# Patient Record
Sex: Male | Born: 1950 | Race: White | Hispanic: No | Marital: Married | Smoking: Never smoker
Health system: Southern US, Community
[De-identification: ages and names within clinical notes are randomized; demographics above are authoritative.]

## PROBLEM LIST (undated history)

## (undated) DIAGNOSIS — N400 Enlarged prostate without lower urinary tract symptoms: Secondary | ICD-10-CM

## (undated) DIAGNOSIS — R972 Elevated prostate specific antigen [PSA]: Secondary | ICD-10-CM

## (undated) HISTORY — DX: Elevated prostate specific antigen (PSA): R97.20

## (undated) HISTORY — DX: Benign prostatic hyperplasia without lower urinary tract symptoms: N40.0

---

## 2003-02-09 HISTORY — PX: CYST EXCISION: SHX5701

## 2016-02-09 HISTORY — PX: GREEN LIGHT LASER TURP (TRANSURETHRAL RESECTION OF PROSTATE: SHX6260

## 2019-10-14 ENCOUNTER — Other Ambulatory Visit: Payer: Self-pay | Admitting: General Surgery

## 2019-10-14 NOTE — Progress Notes (Signed)
CSN MRN  [id number]   Initial consult Open   10/11/2019 [facility] \ \  Umbilical hernia without obstruction and without gangrene +1 more   Dx  Umbilical Hernia; Referred by Self   Reason for Visit    Progress Notes Perry Bell, Geronimo Boot, MD (Physician) . Marland Kitchen General Surgery Unsigned Expand AllCollapse All   Subjective:     Patient ID: Perry Bell is a 69 y.o. male.  HPI  The following portions of the patient's history were reviewed and updated as appropriate.  This a new patient is here today for: office visit. Patient is here today for evaluation of a longstanding umbilical hernia and a fairly recent onset right inguinal hernia.. The patient reports the latter has been present since the beginning of the year.  Slow progression into the top of the scrotum.  Occasionally, with bowel movements he will notice increased discomfort in the right hernia area.  Reduced each morning on arising.  The patient reports when he moved to the [place] he and his wife lived on a house boat for about 3-4 years.  He was working part-time at [employer] and found that he was getting less relaxation and he had when he had a full-time job prior to retirement.  Bowel movements are daily without history of blood or mucus.  The patient reports he had marked relief of his prostatic symptoms with greenlight laser treatment.  No anesthesia complications during this procedure.  The patient recently moved to [place] full time in August from the [place] to be closer to his 5 grandchildren.       Chief Complaint  Patient presents with  . Umbilical Hernia     BP 329/51   Pulse 110   Temp 36.8 C (98.3 F)   Ht 177.8 cm (5\' 10" )   Wt 98.9 kg (218 lb)   SpO2 97%   BMI 31.28 kg/m   History reviewed. No pertinent past medical history.        Past Surgical History:  Procedure Laterality Date  . COLONOSCOPY    . cyst removed testicle  2011   . prostate surgery  2019   green laser       Social History          Socioeconomic History  . Marital status: Married    Spouse name: Not on file  . Number of children: Not on file  . Years of education: Not on file  . Highest education level: Not on file  Occupational History  . Not on file  Tobacco Use  . Smoking status: Never Smoker  . Smokeless tobacco: Never Used  Substance and Sexual Activity  . Alcohol use: Not Currently  . Drug use: Never  . Sexual activity: Not on file  Other Topics Concern  . Not on file  Social History Narrative  . Not on file   Social Determinants of Health      Financial Resource Strain:   . Difficulty of Paying Living Expenses:   Food Insecurity:   . Worried About Charity fundraiser in the Last Year:   . Arboriculturist in the Last Year:   Transportation Needs:   . Film/video editor (Medical):   Marland Kitchen Lack of Transportation (Non-Medical):            Allergies  Allergen Reactions  . Penicillins Rash    Current Medications  Current Outpatient Medications  Medication Sig Dispense Refill  . multivitamin capsule Take 1 capsule by mouth once daily    . tamsulosin (FLOMAX) 0.4 mg capsule Take 0.4 mg by mouth 2 (two) times daily Take 30 minutes after same meal each day.     No current facility-administered medications for this visit.           Family History  Problem Relation Age of Onset  . Colon cancer Maternal Uncle   . Colon cancer Maternal Grandfather   . Breast cancer Daughter      Labs and Radiology:          Review of Systems  Constitutional: Negative for chills and fever.  Respiratory: Negative for cough.        Objective:   Physical Exam Constitutional:      Appearance: Normal appearance.  HENT:     Head: Normocephalic and atraumatic.  Eyes:     General: No scleral icterus.    Conjunctiva/sclera: Conjunctivae normal.     Pupils: Pupils are equal,  round, and reactive to light.  Cardiovascular:     Rate and Rhythm: Normal rate and regular rhythm.     Pulses: Normal pulses.          Femoral pulses are 2+ on the right side and 2+ on the left side.    Heart sounds: Normal heart sounds.  Pulmonary:     Effort: Pulmonary effort is normal.     Breath sounds: Normal breath sounds.  Abdominal:     General: Abdomen is flat and scaphoid. Bowel sounds are normal.    Genitourinary:    Penis: Uncircumcised.      Testes: Normal.  Musculoskeletal:     Cervical back: Normal range of motion and neck supple.     Right lower leg: No edema.     Left lower leg: No edema.  Lymphadenopathy:     Lower Body: No right inguinal adenopathy. No left inguinal adenopathy.  Skin:    General: Skin is warm and dry.  Neurological:     Mental Status: He is alert and oriented to person, place, and time.  Psychiatric:        Mood and Affect: Mood normal.        Behavior: Behavior normal.        Assessment:     Right scrotal hernia, new onset since January 2021.  Longstanding umbilical defect, without significant interval change.  Overdue for follow-up colonoscopy.  (Screening).    Plan:     Indications for elective inguinal hernia repair reviewed.  Role of prosthetic mesh placement discussed.  Opportunity for referral to laparoscopic/robotic physician if desired.  (Declined).  We will defer colonoscopy until after his hernias are repaired.    The patient has been scheduled for right open inguinal hernia repair and umbilical hernia repair for 10-26-19 at [facility].   Entered by Ledell Noss, CMA, acting as a scribe for Dr. Hervey Ard, MD.   The documentation recorded by the scribe accurately reflects the service I personally performed and the decisions made by me.   Robert Bellow, MD FACS

## 2019-10-19 ENCOUNTER — Other Ambulatory Visit
Admission: RE | Admit: 2019-10-19 | Discharge: 2019-10-19 | Disposition: A | Discharge status: Home / Self Care | Payer: Medicare Other | Source: Ambulatory Visit | Attending: General Surgery | Admitting: General Surgery

## 2019-10-19 ENCOUNTER — Other Ambulatory Visit: Payer: Self-pay

## 2019-10-19 NOTE — Patient Instructions (Signed)
Your procedure is scheduled on: Friday October 26, 2019. Report to Day Surgery inside [facility] 2nd floor. To find out your arrival time please call [phone] between 1PM - 3PM on Thursday October 25, 2019.  Remember: Instructions that are not followed completely may result in serious medical risk,  up to and including death, or upon the discretion of your surgeon and anesthesiologist your  surgery may need to be rescheduled.     _X__ 1. Do not eat food after midnight the night before your procedure.                 No chewing gum or hard candies. You may drink clear liquids up to 2 hours                 before you are scheduled to arrive for your surgery- DO not drink clear                 liquids within 2 hours of the start of your surgery.                 Clear Liquids include:  water, apple juice without pulp, clear Gatorade, G2 or                  Gatorade Zero (avoid Red/Purple/Blue), Black Coffee or Tea (Do not add                 anything to coffee or tea).  __X__2.  On the morning of surgery brush your teeth with toothpaste and water, you                may rinse your mouth with mouthwash if you wish.  Do not swallow any toothpaste of mouthwash.     _X__ 3.  No Alcohol for 24 hours before or after surgery.   _X__ 4.  Do Not Smoke or use e-cigarettes For 24 Hours Prior to Your Surgery.                 Do not use any chewable tobacco products for at least 6 hours prior to                 Surgery.  _X__  5.  Do not use any recreational drugs (marijuana, cocaine, heroin, ecstasy, MDMA or other)                For at least one week prior to your surgery.  Combination of these drugs with anesthesia                May have life threatening results.  __x__ 6.  Notify your doctor if there is any change in your medical condition      (cold, fever, infections).     Do not wear jewelry, make-up, hairpins, clips or nail polish. Do not wear lotions,  powders, or perfumes. You may wear deodorant. Do not shave 48 hours prior to surgery. Men may shave face and neck. Do not bring valuables to the hospital.    [facility] is not responsible for any belongings or valuables.  Contacts, dentures or bridgework may not be worn into surgery. Leave your suitcase in the car. After surgery it may be brought to your room. For patients admitted to the hospital, discharge time is determined by your treatment team.   Patients discharged the day of surgery will not be allowed to drive home.   Make arrangements for someone to be  with you for the first 24 hours of your Same Day Discharge.   _x__ Take these medicines the morning of surgery with A SIP OF WATER:    1. tamsulosin (FLOMAX) 0.4 MG   __X__ Use CHG Soap (or wipes) as directed  __x__ Stop Anti-inflammatories such as Ibuprofen, Aleve, Advil, naproxen, aspirin and or BC powders.    __x__ Stop supplements until after surgery.    __x__ Do not start any herbal supplements before your surgery.    If you have any questions regarding your pre-procedure instructions,  Please call Pre-admit Testing at [phone].

## 2019-10-24 ENCOUNTER — Encounter
Admission: RE | Admit: 2019-10-24 | Discharge: 2019-10-24 | Disposition: A | Discharge status: Home / Self Care | Payer: Medicare Other | Source: Ambulatory Visit | Attending: General Surgery | Admitting: General Surgery

## 2019-10-24 ENCOUNTER — Other Ambulatory Visit: Payer: Self-pay

## 2019-10-24 DIAGNOSIS — R54 Age-related physical debility: Secondary | ICD-10-CM | POA: Diagnosis not present

## 2019-10-24 DIAGNOSIS — Z01818 Encounter for other preprocedural examination: Secondary | ICD-10-CM | POA: Diagnosis not present

## 2019-10-24 DIAGNOSIS — Z20822 Contact with and (suspected) exposure to covid-19: Secondary | ICD-10-CM | POA: Insufficient documentation

## 2019-10-24 LAB — BASIC METABOLIC PANEL
Anion gap: 11 (ref 5–15)
BUN: 15 mg/dL (ref 8–23)
CO2: 26 mmol/L (ref 22–32)
Calcium: 9.5 mg/dL (ref 8.9–10.3)
Chloride: 102 mmol/L (ref 98–111)
Creatinine, Ser: 1.2 mg/dL (ref 0.61–1.24)
GFR calc Af Amer: >60 mL/min (ref >60)
GFR calc non Af Amer: >60 mL/min (ref >60)
Glucose, Bld: 115 mg/dL — ABNORMAL HIGH (ref 70–99)
Potassium: 4 mmol/L (ref 3.5–5.1)
Sodium: 139 mmol/L (ref 135–145)

## 2019-10-24 LAB — CBC WITH DIFFERENTIAL/PLATELET
Abs Immature Granulocytes: 0.02 10*3/uL (ref 0.00–0.07)
Basophils Absolute: 0.1 10*3/uL (ref 0.0–0.1)
Basophils Relative: 1 %
Eosinophils Absolute: 0.2 10*3/uL (ref 0.0–0.5)
Eosinophils Relative: 3 %
HCT: 44 % (ref 39.0–52.0)
Hemoglobin: 15.1 g/dL (ref 13.0–17.0)
Immature Granulocytes: 0 %
Lymphocytes Relative: 21 %
Lymphs Abs: 1.8 10*3/uL (ref 0.7–4.0)
MCH: 28.5 pg (ref 26.0–34.0)
MCHC: 34.3 g/dL (ref 30.0–36.0)
MCV: 83.2 fL (ref 80.0–100.0)
Monocytes Absolute: 0.6 10*3/uL (ref 0.1–1.0)
Monocytes Relative: 7 %
Neutro Abs: 5.6 10*3/uL (ref 1.7–7.7)
Neutrophils Relative %: 68 %
Platelets: 250 10*3/uL (ref 150–400)
RBC: 5.29 MIL/uL (ref 4.22–5.81)
RDW: 13 % (ref 11.5–15.5)
WBC: 8.3 10*3/uL (ref 4.0–10.5)
nRBC: 0 % (ref 0.0–0.2)

## 2019-10-25 LAB — SARS CORONAVIRUS 2 (TAT 6-24 HRS): SARS Coronavirus 2: NEGATIVE

## 2019-10-26 ENCOUNTER — Encounter: Payer: Self-pay | Admitting: General Surgery

## 2019-10-26 ENCOUNTER — Encounter
Admission: RE | Disposition: A | Discharge status: Home / Self Care | Payer: Self-pay | Source: Ambulatory Visit | Attending: General Surgery

## 2019-10-26 ENCOUNTER — Ambulatory Visit: Payer: Medicare Other | Admitting: Urgent Care

## 2019-10-26 ENCOUNTER — Ambulatory Visit
Admission: RE | Admit: 2019-10-26 | Discharge: 2019-10-26 | Disposition: A | Discharge status: Home / Self Care | Payer: Medicare Other | Source: Ambulatory Visit | Attending: General Surgery | Admitting: General Surgery

## 2019-10-26 ENCOUNTER — Other Ambulatory Visit: Payer: Self-pay

## 2019-10-26 DIAGNOSIS — Z800 Family history of malignant neoplasm of digestive organs: Secondary | ICD-10-CM | POA: Diagnosis not present

## 2019-10-26 DIAGNOSIS — K4090 Unilateral inguinal hernia, without obstruction or gangrene, not specified as recurrent: Secondary | ICD-10-CM | POA: Diagnosis present

## 2019-10-26 DIAGNOSIS — Z79899 Other long term (current) drug therapy: Secondary | ICD-10-CM | POA: Insufficient documentation

## 2019-10-26 DIAGNOSIS — Z880 Allergy status to penicillin: Secondary | ICD-10-CM | POA: Insufficient documentation

## 2019-10-26 DIAGNOSIS — Z803 Family history of malignant neoplasm of breast: Secondary | ICD-10-CM | POA: Diagnosis not present

## 2019-10-26 DIAGNOSIS — K429 Umbilical hernia without obstruction or gangrene: Secondary | ICD-10-CM | POA: Diagnosis not present

## 2019-10-26 DIAGNOSIS — K4030 Unilateral inguinal hernia, with obstruction, without gangrene, not specified as recurrent: Secondary | ICD-10-CM | POA: Insufficient documentation

## 2019-10-26 DIAGNOSIS — K660 Peritoneal adhesions (postprocedural) (postinfection): Secondary | ICD-10-CM | POA: Insufficient documentation

## 2019-10-26 HISTORY — PX: UMBILICAL HERNIA REPAIR: SHX196

## 2019-10-26 HISTORY — PX: INGUINAL HERNIA REPAIR: SHX194

## 2019-10-26 SURGERY — REPAIR, HERNIA, UMBILICAL, ADULT
Anesthesia: General | Site: Groin | Laterality: Right

## 2019-10-26 MED ORDER — FAMOTIDINE 20 MG PO TABS: 20.0000 mg | ORAL_TABLET | Freq: Once | ORAL | Status: AC

## 2019-10-26 MED ORDER — LIDOCAINE HCL (CARDIAC) PF 100 MG/5ML IV SOSY
PREFILLED_SYRINGE | INTRAVENOUS | Status: DC | PRN
  Administered 2019-10-26: 100 mg via INTRAVENOUS

## 2019-10-26 MED ORDER — 0.9 % SODIUM CHLORIDE (POUR BTL) OPTIME
TOPICAL | Status: DC | PRN
  Administered 2019-10-26: 500 mL

## 2019-10-26 MED ORDER — LIDOCAINE-EPINEPHRINE 1 %-1:100000 IJ SOLN
INTRAMUSCULAR | Status: DC | PRN
  Administered 2019-10-26: 30 mL

## 2019-10-26 MED ORDER — OXYCODONE HCL 5 MG PO TABS: 5.0000 mg | ORAL_TABLET | Freq: Once | ORAL | Status: DC | PRN

## 2019-10-26 MED ORDER — ONDANSETRON HCL 4 MG/2ML IJ SOLN: 4.0000 mg | Freq: Once | INTRAMUSCULAR | Status: AC | PRN

## 2019-10-26 MED ORDER — ACETAMINOPHEN 10 MG/ML IV SOLN
INTRAVENOUS | Status: DC | PRN
  Administered 2019-10-26: 1000 mg via INTRAVENOUS

## 2019-10-26 MED ORDER — EPHEDRINE SULFATE 50 MG/ML IJ SOLN
INTRAMUSCULAR | Status: DC | PRN
  Administered 2019-10-26: 5 mg via INTRAVENOUS
  Administered 2019-10-26: 10 mg via INTRAVENOUS
  Administered 2019-10-26: 5 mg via INTRAVENOUS

## 2019-10-26 MED ORDER — ACETAMINOPHEN 10 MG/ML IV SOLN
INTRAVENOUS | Status: AC
  Filled 2019-10-26: qty 100

## 2019-10-26 MED ORDER — CHLORHEXIDINE GLUCONATE 0.12 % MT SOLN: 15.0000 mL | Freq: Once | OROMUCOSAL | Status: AC

## 2019-10-26 MED ORDER — FENTANYL CITRATE (PF) 100 MCG/2ML IJ SOLN
INTRAMUSCULAR | Status: AC
  Filled 2019-10-26: qty 2

## 2019-10-26 MED ORDER — FAMOTIDINE 20 MG PO TABS
ORAL_TABLET | ORAL | Status: AC
  Administered 2019-10-26: 20 mg via ORAL
  Filled 2019-10-26: qty 1

## 2019-10-26 MED ORDER — KETOROLAC TROMETHAMINE 30 MG/ML IJ SOLN
INTRAMUSCULAR | Status: DC | PRN
  Administered 2019-10-26: 30 mg via INTRAVENOUS

## 2019-10-26 MED ORDER — PROPOFOL 10 MG/ML IV BOLUS
INTRAVENOUS | Status: DC | PRN
  Administered 2019-10-26: 50 mg via INTRAVENOUS
  Administered 2019-10-26: 150 mg via INTRAVENOUS

## 2019-10-26 MED ORDER — FENTANYL CITRATE (PF) 100 MCG/2ML IJ SOLN
INTRAMUSCULAR | Status: DC | PRN
Start: 2019-10-26 — End: 2019-10-26
  Administered 2019-10-26 (×5): 25 ug via INTRAVENOUS

## 2019-10-26 MED ORDER — LACTATED RINGERS IV SOLN: INTRAVENOUS | Status: DC

## 2019-10-26 MED ORDER — CHLORHEXIDINE GLUCONATE 0.12 % MT SOLN
OROMUCOSAL | Status: AC
  Administered 2019-10-26: 15 mL via OROMUCOSAL
  Filled 2019-10-26: qty 15

## 2019-10-26 MED ORDER — FENTANYL CITRATE (PF) 100 MCG/2ML IJ SOLN: 25.0000 ug | INTRAMUSCULAR | Status: DC | PRN

## 2019-10-26 MED ORDER — CEFAZOLIN SODIUM-DEXTROSE 2-4 GM/100ML-% IV SOLN
2.0000 g | INTRAVENOUS | Status: AC
  Administered 2019-10-26: 2 g via INTRAVENOUS

## 2019-10-26 MED ORDER — ONDANSETRON HCL 4 MG/2ML IJ SOLN
INTRAMUSCULAR | Status: DC | PRN
  Administered 2019-10-26: 4 mg via INTRAVENOUS

## 2019-10-26 MED ORDER — EPINEPHRINE PF 1 MG/ML IJ SOLN
INTRAMUSCULAR | Status: AC
  Filled 2019-10-26: qty 1

## 2019-10-26 MED ORDER — LIDOCAINE-EPINEPHRINE 1 %-1:100000 IJ SOLN
INTRAMUSCULAR | Status: AC
  Filled 2019-10-26: qty 2

## 2019-10-26 MED ORDER — DEXMEDETOMIDINE (PRECEDEX) IN NS 20 MCG/5ML (4 MCG/ML) IV SYRINGE
PREFILLED_SYRINGE | INTRAVENOUS | Status: AC
  Filled 2019-10-26: qty 5

## 2019-10-26 MED ORDER — BUPIVACAINE-EPINEPHRINE (PF) 0.5% -1:200000 IJ SOLN
INTRAMUSCULAR | Status: DC | PRN
  Administered 2019-10-26: 30 mL

## 2019-10-26 MED ORDER — LIDOCAINE HCL (PF) 2 % IJ SOLN
INTRAMUSCULAR | Status: AC
  Filled 2019-10-26: qty 5

## 2019-10-26 MED ORDER — DEXMEDETOMIDINE (PRECEDEX) IN NS 20 MCG/5ML (4 MCG/ML) IV SYRINGE
PREFILLED_SYRINGE | INTRAVENOUS | Status: DC | PRN
  Administered 2019-10-26 (×2): 4 ug via INTRAVENOUS

## 2019-10-26 MED ORDER — BUPIVACAINE HCL (PF) 0.5 % IJ SOLN
INTRAMUSCULAR | Status: AC
  Filled 2019-10-26: qty 30

## 2019-10-26 MED ORDER — OXYCODONE HCL 5 MG/5ML PO SOLN: 5.0000 mg | Freq: Once | ORAL | Status: DC | PRN

## 2019-10-26 MED ORDER — PROPOFOL 10 MG/ML IV BOLUS
INTRAVENOUS | Status: AC
  Filled 2019-10-26: qty 20

## 2019-10-26 MED ORDER — ONDANSETRON HCL 4 MG/2ML IJ SOLN
INTRAMUSCULAR | Status: AC
  Administered 2019-10-26: 4 mg via INTRAVENOUS
  Filled 2019-10-26: qty 2

## 2019-10-26 MED ORDER — CEFAZOLIN SODIUM-DEXTROSE 2-4 GM/100ML-% IV SOLN
INTRAVENOUS | Status: AC
  Filled 2019-10-26: qty 100

## 2019-10-26 MED ORDER — ORAL CARE MOUTH RINSE: 15.0000 mL | Freq: Once | OROMUCOSAL | Status: AC

## 2019-10-26 SURGICAL SUPPLY — 39 items
APL PRP STRL LF DISP 70% ISPRP (MISCELLANEOUS) ×2
BLADE SURG 15 STRL SS SAFETY (BLADE) ×6 IMPLANT
CANISTER SUCT 1200ML W/VALVE (MISCELLANEOUS) ×3 IMPLANT
CHLORAPREP W/TINT 26 (MISCELLANEOUS) ×3 IMPLANT
COVER WAND RF STERILE (DRAPES) ×3 IMPLANT
DECANTER SPIKE VIAL GLASS SM (MISCELLANEOUS) ×3 IMPLANT
DRAIN PENROSE 1/4X12 LTX STRL (WOUND CARE) ×3 IMPLANT
DRAPE LAPAROTOMY 100X77 ABD (DRAPES) ×3 IMPLANT
DRSG TEGADERM 4X4.75 (GAUZE/BANDAGES/DRESSINGS) ×4 IMPLANT
DRSG TELFA 4X3 1S NADH ST (GAUZE/BANDAGES/DRESSINGS) ×4 IMPLANT
ELECT REM PT RETURN 9FT ADLT (ELECTROSURGICAL) ×3
ELECTRODE REM PT RTRN 9FT ADLT (ELECTROSURGICAL) ×2 IMPLANT
GLOVE BIO SURGEON STRL SZ7.5 (GLOVE) ×3 IMPLANT
GLOVE INDICATOR 8.0 STRL GRN (GLOVE) ×3 IMPLANT
GOWN STRL REUS W/ TWL LRG LVL3 (GOWN DISPOSABLE) ×4 IMPLANT
GOWN STRL REUS W/TWL LRG LVL3 (GOWN DISPOSABLE) ×6
KIT TURNOVER KIT A (KITS) ×3 IMPLANT
LABEL OR SOLS (LABEL) ×3 IMPLANT
MESH MARLEX PLUG MEDIUM (Mesh General) ×3 IMPLANT
NDL HYPO 25X1 1.5 SAFETY (NEEDLE) ×2 IMPLANT
NEEDLE HYPO 22GX1.5 SAFETY (NEEDLE) ×6 IMPLANT
NEEDLE HYPO 25X1 1.5 SAFETY (NEEDLE) ×3 IMPLANT
NS IRRIG 500ML POUR BTL (IV SOLUTION) ×3 IMPLANT
PACK BASIN MINOR (MISCELLANEOUS) ×3 IMPLANT
STRIP CLOSURE SKIN 1/2X4 (GAUZE/BANDAGES/DRESSINGS) ×3 IMPLANT
SUT PDS AB 0 CT1 27 (SUTURE) ×3 IMPLANT
SUT PROLENE 0 CT 1 30 (SUTURE) ×3 IMPLANT
SUT SURGILON 0 BLK (SUTURE) ×6 IMPLANT
SUT VIC AB 2-0 SH 27 (SUTURE) ×6
SUT VIC AB 2-0 SH 27XBRD (SUTURE) ×2 IMPLANT
SUT VIC AB 3-0 54X BRD REEL (SUTURE) ×4 IMPLANT
SUT VIC AB 3-0 BRD 54 (SUTURE) ×6
SUT VIC AB 3-0 SH 27 (SUTURE) ×6
SUT VIC AB 3-0 SH 27X BRD (SUTURE) ×2 IMPLANT
SUT VIC AB 4-0 FS2 27 (SUTURE) ×4 IMPLANT
SWABSTK COMLB BENZOIN TINCTURE (MISCELLANEOUS) ×3 IMPLANT
SYR 10ML LL (SYRINGE) ×3 IMPLANT
SYR 3ML LL SCALE MARK (SYRINGE) ×3 IMPLANT
TAPE TRANSPORE STRL 2 31045 (GAUZE/BANDAGES/DRESSINGS) ×1 IMPLANT

## 2019-10-26 NOTE — Anesthesia Postprocedure Evaluation (Signed)
Anesthesia Post Note  Patient: Adonai Selsor  Procedure(s) Performed: HERNIA REPAIR UMBILICAL ADULT (N/A Abdomen) RIGHT INGUINAL HERNIA REPAIR WITH MESH (Right Groin)  Patient location during evaluation: PACU Anesthesia Type: General Level of consciousness: awake and alert Pain management: pain level controlled Vital Signs Assessment: post-procedure vital signs reviewed and stable Respiratory status: spontaneous breathing, nonlabored ventilation, respiratory function stable and patient connected to nasal cannula oxygen Cardiovascular status: blood pressure returned to baseline and stable Postop Assessment: no apparent nausea or vomiting Anesthetic complications: no   No complications documented.   Last Vitals:  Vitals:   10/26/19 1628 10/26/19 1651  BP: (!) 146/83 128/80  Pulse: 77 79  Resp: 14 16  Temp: (!) 36.2 C   SpO2: 96% 96%    Last Pain:  Vitals:   10/26/19 1651  TempSrc:   PainSc: 2                  Molli Barrows

## 2019-10-26 NOTE — Transfer of Care (Signed)
Immediate Anesthesia Transfer of Care Note  Patient: Perry Bell  Procedure(s) Performed: HERNIA REPAIR UMBILICAL ADULT (N/A Abdomen) RIGHT INGUINAL HERNIA REPAIR WITH MESH (Right Groin)  Patient Location: PACU  Anesthesia Type:General  Level of Consciousness: awake, alert  and oriented  Airway & Oxygen Therapy: Patient Spontanous Breathing and Patient connected to face mask oxygen  Post-op Assessment: Report given to RN and Post -op Vital signs reviewed and stable  Post vital signs: Reviewed and stable  Last Vitals:  Vitals Value Taken Time  BP 135/86 10/26/19 1525  Temp    Pulse 97 10/26/19 1527  Resp 17 10/26/19 1527  SpO2 98 % 10/26/19 1527  Vitals shown include unvalidated device data.  Last Pain:  Vitals:   10/26/19 1213  TempSrc: Temporal  PainSc: 0-No pain         Complications: No complications documented.

## 2019-10-26 NOTE — H&P (Signed)
Perry Bell [id number] July 29, 1950     HPI:  Patient with symptomatic right inguinal hernia and a long standing umbilical hernia.  For elective repair.    Medications Prior to Admission  Medication Sig Dispense Refill Last Dose  . Multiple Vitamin (MULTIVITAMIN WITH MINERALS) TABS tablet Take 1 tablet by mouth daily.   10/25/2019 at Unknown time  . tamsulosin (FLOMAX) 0.4 MG CAPS capsule Take 0.8 mg by mouth daily.   10/25/2019 at Unknown time   Allergies  Allergen Reactions  . Penicillins Rash   History reviewed. No pertinent past medical history. Past Surgical History:  Procedure Laterality Date  . CYST EXCISION  2005   from Left Testicle   . GREEN LIGHT LASER TURP (TRANSURETHRAL RESECTION OF PROSTATE  2018   Social History   Socioeconomic History  . Marital status: Unknown    Spouse name: Not on file  . Number of children: Not on file  . Years of education: Not on file  . Highest education level: Not on file  Occupational History  . Not on file  Tobacco Use  . Smoking status: Never Smoker  . Smokeless tobacco: Never Used  Vaping Use  . Vaping Use: Never used  Substance and Sexual Activity  . Alcohol use: Never  . Drug use: Never  . Sexual activity: Not on file  Other Topics Concern  . Not on file  Social History Narrative  . Not on file   Social Determinants of Health   Financial Resource Strain:   . Difficulty of Paying Living Expenses: Not on file  Food Insecurity:   . Worried About Charity fundraiser in the Last Year: Not on file  . Ran Out of Food in the Last Year: Not on file  Transportation Needs:   . Lack of Transportation (Medical): Not on file  . Lack of Transportation (Non-Medical): Not on file  Physical Activity:   . Days of Exercise per Week: Not on file  . Minutes of Exercise per Session: Not on file  Stress:   . Feeling of Stress : Not on file  Social Connections:   . Frequency of Communication with Friends and Family: Not on file  .  Frequency of Social Gatherings with Friends and Family: Not on file  . Attends Religious Services: Not on file  . Active Member of Clubs or Organizations: Not on file  . Attends Archivist Meetings: Not on file  . Marital Status: Not on file  Intimate Partner Violence:   . Fear of Current or Ex-Partner: Not on file  . Emotionally Abused: Not on file  . Physically Abused: Not on file  . Sexually Abused: Not on file   Social History   Social History Narrative  . Not on file     ROS: Negative.     PE: HEENT: Negative. Lungs: Clear. Cardio: RR.  Assessment/Plan:  Proceed with planned right inguinal hernia and umbilical hernia repair.   Forest Gleason [facility] 10/26/2019

## 2019-10-26 NOTE — OR Nursing (Signed)
Per Dr. Bary Castilla, e-chat, ok to discharge pt to home without his visit to postop.

## 2019-10-26 NOTE — Anesthesia Preprocedure Evaluation (Addendum)
Anesthesia Evaluation  Patient identified by MRN, date of birth, ID band Patient awake    Reviewed: Allergy & Precautions, H&P , NPO status , Patient's Chart, lab work & pertinent test results  History of Anesthesia Complications Negative for: history of anesthetic complications  Airway Mallampati: II  TM Distance: >3 FB     Dental  (+) Teeth Intact   Pulmonary neg pulmonary ROS, neg sleep apnea, neg COPD,    breath sounds clear to auscultation       Cardiovascular (-) angina(-) Past MI and (-) Cardiac Stents negative cardio ROS  (-) dysrhythmias  Rhythm:regular Rate:Normal     Neuro/Psych negative neurological ROS  negative psych ROS   GI/Hepatic negative GI ROS, Neg liver ROS,   Endo/Other  negative endocrine ROS  Renal/GU      Musculoskeletal   Abdominal   Peds  Hematology negative hematology ROS (+)   Anesthesia Other Findings History reviewed. No pertinent past medical history.  Past Surgical History: 2005: CYST EXCISION     Comment:  from Left Testicle  2018: [place] TURP (TRANSURETHRAL RESECTION OF PROSTATE     Reproductive/Obstetrics negative OB ROS                            Anesthesia Physical Anesthesia Plan  ASA: I  Anesthesia Plan: General LMA   Post-op Pain Management:    Induction:   PONV Risk Score and Plan: Dexamethasone, Ondansetron and Treatment may vary due to age or medical condition  Airway Management Planned:   Additional Equipment:   Intra-op Plan:   Post-operative Plan:   Informed Consent: I have reviewed the patients History and Physical, chart, labs and discussed the procedure including the risks, benefits and alternatives for the proposed anesthesia with the patient or authorized representative who has indicated his/her understanding and acceptance.     Dental Advisory Given  Plan Discussed with: Anesthesiologist, CRNA and  Surgeon  Anesthesia Plan Comments:         Anesthesia Quick Evaluation

## 2019-10-26 NOTE — Discharge Instructions (Signed)

## 2019-10-26 NOTE — Op Note (Signed)
Preoperative diagnosis 1) incarcerated right inguinal hernia, omentum; 2) umbilical hernia.  Postoperative diagnosis: Same.  Operative procedure: Repair of right inguinal hernia with a medium PerFix plug and patch, primary repair of umbilical hernia.  Operating surgeon: Hervey Ard, MD.  Anesthesia: General by LMA; Xylocaine 0.5% with Marcaine 0.25% with 1: 200,000 units of epinephrine, 60 cc local infiltration, Toradol 30 mg.  Estimated blood loss: Less than 5 cc.  Clinical note: This patient has had a longstanding umbilical hernia but recently developed a very symptomatic right inguinal hernia.  This is very difficult if impossible to reduce.  No symptoms suggestive of bowel obstruction.  He was admitted for elective repair.  Hair was removed from the surgical site prior to presentation of the operating theater.  SCD stockings for DVT prevention.  Ancef 2 g intravenously for antibiotic prophylaxis.  Operative note: With the patient under adequate general anesthesia the abdomen was cleansed with ChloraPrep and draped.  Attention was turned to the right groin.  Field block anesthesia was established for postoperative analgesia.  20 cc of the above-mentioned local anesthetic was infiltrated at least 5 cm away from the periumbilical skin for postoperative analgesia.  A 5 cm skin line incision along the anticipated course the inguinal canal was carried down through skin subtendinous tissue with hemostasis achieved with 3-0 Vicryl ties and electrocautery.  The external oblique was opened in the direction of its fibers.  A large initially nonreducible hernia was appreciated.  The cord structures were mobilized followed by the hernia sac.  The sac was opened and a large amount of omentum approximately 8-10 cm in length, 6 cm in width, was identified.  This was freed from the adhesions at the tip of the sac with electrocautery and the omentum reduced into the abdominal cavity.  The sac which was about 8  inches in length was transfixed with a 2-0 Vicryl suture ligature under direct vision followed by 2-0 Vicryl tie amputated and discarded.  The preperitoneal space was cleared and the defect was relatively small considering the volume of omentum that had been coming through the hernia.  It was elected to place a medium PerFix plug.  This was anchored to the iliotibial pubic tract in the superior aspect of the internal inguinal ring.  This was done with a 2-0 Surgilon figure-of-eight suture.  The onlay portion of the mesh was anchored to the pubic tubercle with 0 Surgilon as well as to the inguinal ligament with interrupted 0 Surgilon sutures.  The medial and superior borders were anchored to the transverse abdominis aponeurosis.  The lateral slit for cord passage was closed.  Toradol was placed in the wound.  The external oblique was closed with a running 2-0 Vicryl suture.  Scarpa's fascia was closed with a running 3-0 Vicryl suture.  The skin was closed with a running 4-0 Vicryl subcuticular suture.  Attention was turned to the umbilical defect.  An infraumbilical incision was made along the skin fold.  The skin was incised sharply and remaining dissection completed with electrocautery.  The sac abutted the overlying skin.  This was dissected and resected.  This was not sent for histologic review.  The fascia at the umbilical opening was cleared.  It measured under 2 cm in diameter.  It was elected to complete a primary repair.  The undersurface of the fascia was cleared with electrocautery and hemostasis achieved.  Interrupted 0 Surgilon figure-of-eight sutures were placed at the apex followed by interrupted simple sutures in the midportion  of the defect.  These were all placed and then tied sequentially.  The umbilical skin was tacked to the deep fascia with a 3-0 Vicryl figure-of-eight suture.  The superficial layer was closed with a running 3-0 Vicryl suture and the skin closed with a running 4-Vicryl  subcuticular suture.  Benzoin, Steri-Strips, Telfa and Tegaderm dressings were then applied.  The patient tolerated the procedure well and was taken to the recovery room in stable condition.

## 2019-10-26 NOTE — Anesthesia Procedure Notes (Signed)
Procedure Name: LMA Insertion Date/Time: 10/26/2019 1:33 PM Performed by: Natasha Mead, CRNA Pre-anesthesia Checklist: Patient identified, Emergency Drugs available, Suction available, Patient being monitored and Timeout performed Patient Re-evaluated:Patient Re-evaluated prior to induction Oxygen Delivery Method: Circle system utilized Preoxygenation: Pre-oxygenation with 100% oxygen Induction Type: IV induction Ventilation: Mask ventilation without difficulty LMA: LMA flexible inserted LMA Size: 4.0 Dental Injury: Injury to lip

## 2019-10-27 ENCOUNTER — Encounter: Payer: Self-pay | Admitting: General Surgery

## 2020-07-04 ENCOUNTER — Ambulatory Visit: Payer: Medicare Other | Admitting: Urology

## 2020-07-23 ENCOUNTER — Other Ambulatory Visit: Payer: Self-pay

## 2020-07-23 ENCOUNTER — Ambulatory Visit: Payer: Medicare Other | Admitting: Urology

## 2020-07-23 ENCOUNTER — Encounter: Payer: Self-pay | Admitting: Urology

## 2020-07-23 VITALS — BP 130/80 | HR 9 | Ht 69.0 in | Wt 205.0 lb

## 2020-07-23 DIAGNOSIS — N138 Other obstructive and reflux uropathy: Secondary | ICD-10-CM

## 2020-07-23 DIAGNOSIS — N401 Enlarged prostate with lower urinary tract symptoms: Secondary | ICD-10-CM

## 2020-07-23 DIAGNOSIS — R972 Elevated prostate specific antigen [PSA]: Secondary | ICD-10-CM | POA: Diagnosis not present

## 2020-07-23 DIAGNOSIS — N3000 Acute cystitis without hematuria: Secondary | ICD-10-CM

## 2020-07-23 LAB — BLADDER SCAN AMB NON-IMAGING: Scan Result: 97

## 2020-07-23 NOTE — Progress Notes (Signed)
07/23/2020 1:49 PM   Perry Bell [birth year] [id number]  Referring provider: Leonel Ramsay, MD [place],  [place] [address]  Chief Complaint  Patient presents with   Elevated PSA    HPI: 70 year old male who presents today for further evaluation of markedly elevated PSA.  He had a PSA drawn in November 2021 which was markedly elevated to 19.82.  This was repeated in 06/2020 and noted to have risen to 21.57.  He does have a personal history of BPH and underwent greenlight laser ablation of the prostate in 2018 in [place].  Prior to the surgery, he had a prostate biopsy which was negative.  He also had 2 previous negative biopsies up in [place] from a urologist who is now retired.  He remains on Flomax.  After surgery, he had to self cath for period of time leading up to and then about a month after the surgery.  He was doing this about 3 times a day.  Eventually, he was able to wean himself off of self cath but he does still occasionally check residuals every several months which tend to be less than 100 cc.  Per his history, he is a longstanding history of elevated PSA.  He has had biopsies in the past.  Per his own report, his PSA generally runs in the range of 13.  He does report that he has a suspicion that his prostate is growing back.  He has had some progression of his urinary symptoms including difficulty starting his stream and feeling like he has to sit there and relax before he can get it started.  When he does go, he does think that he is emptying his bladder.  He does not get up at night to void.  He does not have significant frequency or urgency.  Interesting today, urinalysis is frankly positive although he overtly denies dysuria or bladder pain.  He reports that his brothers also have the same history of elevated PSAs and have had multiple biopsies which are negative.  One of his brothers has had a prostate MRI.  He has never personally had a  prostate MRI.   PMH: Past Medical History:  Diagnosis Date   Elevated PSA    Enlarged prostate     Surgical History: Past Surgical History:  Procedure Laterality Date   CYST EXCISION  2005   from Left Testicle    GREEN LIGHT LASER TURP (TRANSURETHRAL RESECTION OF PROSTATE  2018   INGUINAL HERNIA REPAIR Right 10/26/2019   Procedure: RIGHT INGUINAL HERNIA REPAIR WITH MESH;  Surgeon: Robert Bellow, MD;  Location: [facility] ORS;  Service: General;  Laterality: Right;   UMBILICAL HERNIA REPAIR N/A 10/26/2019   Procedure: HERNIA REPAIR UMBILICAL ADULT;  Surgeon: Robert Bellow, MD;  Location: [facility] ORS;  Service: General;  Laterality: N/A;    Home Medications:  Allergies as of 07/23/2020       Reactions   Penicillins Rash        Medication List        Accurate as of July 23, 2020  1:49 PM. If you have any questions, ask your nurse or doctor.          multivitamin with minerals Tabs tablet Take 1 tablet by mouth daily.   tamsulosin 0.4 MG Caps capsule Commonly known as: FLOMAX Take 0.4 mg by mouth in the morning and at bedtime.        Allergies:  Allergies  Allergen Reactions  Penicillins Rash    Family History: Family History  Problem Relation Age of Onset   Prostate cancer Maternal Grandfather     Social History:  reports that he has never smoked. He has never used smokeless tobacco. He reports that he does not drink alcohol and does not use drugs.   Physical Exam: BP 130/80   Pulse (!) 9   Ht 5\' 9"  (1.753 m)   Wt 205 lb (93 kg)   BMI 30.27 kg/m   Constitutional:  Alert and oriented, No acute distress. HEENT: [place] AT, moist mucus membranes.  Trachea midline, no masses. Cardiovascular: No clubbing, cyanosis, or edema. Respiratory: Normal respiratory effort, no increased work of breathing. GI: Abdomen is soft, nontender, nondistended, no abdominal masses Rectal: Normal sphincter tone.  Enlarged 50+ cc prostate, nontender, rubbery lateral lobes  without nodularity. Skin: No rashes, bruises or suspicious lesions. Neurologic: Grossly intact, no focal deficits, moving all 4 extremities. Psychiatric: Normal mood and affect.  Laboratory Data: Lab Results  Component Value Date   WBC 8.3 10/24/2019   HGB 15.1 10/24/2019   HCT 44.0 10/24/2019   MCV 83.2 10/24/2019   PLT 250 10/24/2019    Lab Results  Component Value Date   CREATININE 1.20 10/24/2019     Urinalysis Urinalysis today with greater than 30 red blood cells, greater than 30 white blood cells, nitrate positive with many bacteria  Pertinent Imaging: Results for orders placed or performed in visit on 07/23/20  BLADDER SCAN AMB NON-IMAGING  Result Value Ref Range   Scan Result 97 ml     Assessment & Plan:    1. Elevated PSA Markedly elevated PSA although it sounds like he has a personal history of this test was multiple negative biopsies  We discussed that it be helpful to try to obtain records from his previous urologist to help elucidate prostate volume as well as to his PSA to help adjust for size as well PSA velocity  Given his had multiple previous biopsies, he may be a good candidate for prostate MRI for further evaluation of his elevated PSA as well as to guide future surveillance/biopsies  He signed a requisition to obtain records, we will have him follow-up in a month to reassess  Prostate exam today was consistent with known prostamegaly - BLADDER SCAN AMB NON-IMAGING - Urinalysis, Complete - CULTURE, URINE COMPREHENSIVE  2. BPH with urinary obstruction Increasing obstructive urinary symptoms in the setting of massive prostamegaly  We discussed down the road, he may require another outlet procedure or urodynamics to assess bladder function as it sounds like he had a longstanding history of obstruction and has some chronic bladder changes as a result  Continue Flomax for the time being  3. Acute cystitis without hematuria Incidental UTI today but  in the setting of slightly increased urinary symptoms, treatment may potentially benefit him  No dysuria so we will hold off on treating with antibiotics today but will treat based on culture and sensitivity data.  He is agreeable this plan.  If he develops any worsening symptoms including dysuria, fevers or chills or pain in the interim, will treat earlier.  Follow-up in 1 month for record review  Hollice Espy, MD  [facility] [address] [place], [place] [address] [phone]

## 2020-07-25 LAB — MICROSCOPIC EXAMINATION
RBC, Urine: >30 /hpf — AB (ref 0–2)
WBC, UA: >30 /hpf — AB (ref 0–5)

## 2020-07-25 LAB — URINALYSIS, COMPLETE
Bilirubin, UA: NEGATIVE
Glucose, UA: NEGATIVE
Ketones, UA: NEGATIVE
Nitrite, UA: POSITIVE — AB
Specific Gravity, UA: 1.02 (ref 1.005–1.030)
Urobilinogen, Ur: 0.2 mg/dL (ref 0.2–1.0)
pH, UA: 7 (ref 5.0–7.5)

## 2020-07-26 LAB — CULTURE, URINE COMPREHENSIVE

## 2020-07-28 ENCOUNTER — Telehealth: Payer: Self-pay | Admitting: *Deleted

## 2020-07-28 MED ORDER — NITROFURANTOIN MONOHYD MACRO 100 MG PO CAPS: 100.0000 mg | ORAL_CAPSULE | Freq: Two times a day (BID) | ORAL | 0 refills | Status: DC

## 2020-07-28 NOTE — Telephone Encounter (Addendum)
Patient informed, voiced understanding. RX sent to [facility]   ----- Message from Hollice Espy, MD sent at 07/28/2020  4:07 PM EDT ----- Please treat with macrobid 100 mg bid x 10 days  Hollice Espy, MD

## 2020-09-01 NOTE — Progress Notes (Signed)
09/02/2020 11:44 AM   Perry Bell 1950/06/21 [id number]  Referring provider: Jonathon Jordan, MD [facility] [place],  [place] [address]  Chief Complaint  Patient presents with   Elevated PSA    HPI: 70 year old male who returns for 1 month follow-up with records.  Please see previous note.  Essentially, he was referred for markedly elevated PSA but has a complex GU history including a personal history of BPH, urinary retention status post greenlight laser in 2019.  We have since received notes from [facility], Dr. Aubery Lapping which were personally reviewed today.     He does have a personal history of elevated PSA.  He has a documented PSA of 20.1 in 05/2017.  Notably, this was just prior to his prostate surgery in the setting of massive urinary retention with bilateral hydronephrosis.  He did have a prostate biopsy at the time.  Estimated prostate volume was 120 cc by TRUS.  Per documentation, he also had a negative prostate biopsy in [place] in 2012 at which time his PSA was 7.  He had a PSA drawn in November 2021 which was markedly elevated to 19.82.  This was repeated in 06/2020 and noted to have risen to 21.57  At last visit, he had a frankly positive urine.  He was treated with antibiotics with a 10-day course of Macrobid based on his culture and sensitivity data.  Interestingly, he reports that he did not realize that he was having severe urinary symptoms at the time but now in retrospect after having completed the antibiotics, he thinks that he was in fact quite symptomatic.  He reports that about a week after completing the antibiotics, symptoms quickly recurred.  He has recurrent symptoms today.    His urine is somewhat suspicious with greater than 30 white blood cells, 11-30 red blood cells, and many bacteria.    PMH: Past Medical History:  Diagnosis Date   Elevated PSA    Enlarged prostate     Surgical History: Past  Surgical History:  Procedure Laterality Date   CYST EXCISION  2005   from Left Testicle    GREEN LIGHT LASER TURP (TRANSURETHRAL RESECTION OF PROSTATE  2018   INGUINAL HERNIA REPAIR Right 10/26/2019   Procedure: RIGHT INGUINAL HERNIA REPAIR WITH MESH;  Surgeon: Robert Bellow, MD;  Location: [facility];  Service: General;  Laterality: Right;   UMBILICAL HERNIA REPAIR N/A 10/26/2019   Procedure: HERNIA REPAIR UMBILICAL ADULT;  Surgeon: Robert Bellow, MD;  Location: [facility] OR[facility];  Service: General;  Laterality: N/A;    Home Medications:  Allergies as of 09/02/2020       Reactions   Penicillins Rash        Medication List        Accurate as of September 02, 2020 11:44 AM. If you have any questions, ask your nurse or doctor.          STOP taking these medications    nitrofurantoin (macrocrystal-monohydrate) 100 MG capsule Commonly known as: Macrobid Stopped by: Hollice Espy, MD       TAKE these medications    multivitamin with minerals Tabs tablet Take 1 tablet by mouth daily.   tamsulosin 0.4 MG Caps capsule Commonly known as: FLOMAX Take 0.4 mg by mouth in the morning and at bedtime.        Allergies:  Allergies  Allergen Reactions   Penicillins Rash    Family History: Family History  Problem  Relation Age of Onset   Prostate cancer Maternal Grandfather     Social History:  reports that he has never smoked. He has never used smokeless tobacco. He reports that he does not drink alcohol and does not use drugs.   Physical Exam: BP 127/84   Pulse 76   Ht '5\' 9"'$  (1.753 m)   Wt 205 lb (93 kg)   BMI 30.27 kg/m   Constitutional:  Alert and oriented, No acute distress. HEENT: [place] AT, moist mucus membranes.  Trachea midline, no masses. Cardiovascular: No clubbing, cyanosis, or edema. Respiratory: Normal respiratory effort, no increased work of breathing. Skin: No rashes, bruises or suspicious lesions. Neurologic: Grossly intact, no focal deficits, moving  all 4 extremities. Psychiatric: Normal mood and affect.  Urinalysis Results for orders placed or performed in visit on 09/02/20  CULTURE, URINE COMPREHENSIVE   Specimen: Urine   UR  Result Value Ref Range   Urine Culture, Comprehensive Preliminary report    Organism ID, Bacteria Comment   Microscopic Examination   Urine  Result Value Ref Range   WBC, UA >30 (A) 0 - 5 /hpf   RBC 11-30 (A) 0 - 2 /hpf   Epithelial Cells (non renal) 0-10 0 - 10 /hpf   Crystals Present (A) N/A   Crystal Type Amorphous Sediment N/A   Bacteria, UA Many (A) None seen/Few  Urinalysis, Complete  Result Value Ref Range   Specific Gravity, UA 1.025 1.005 - 1.030   pH, UA 7.0 5.0 - 7.5   Color, UA Yellow Yellow   Appearance Ur Cloudy (A) Clear   Leukocytes,UA Trace (A) Negative   Protein,UA 2+ (A) Negative/Trace   Glucose, UA Negative Negative   Ketones, UA Trace (A) Negative   RBC, UA 2+ (A) Negative   Bilirubin, UA Negative Negative   Urobilinogen, Ur 0.2 0.2 - 1.0 mg/dL   Nitrite, UA Positive (A) Negative   Microscopic Examination See below:      Assessment & Plan:    1. Benign prostatic hyperplasia with incomplete bladder emptying Continues to be significantly symptomatic, unclear whether or not this is a failure treatment related or related to acute cystitis  We discussed that depending on how he does with antibiotics for symptoms, if he fails to improve significantly, would recommend cystoscopy/TRUS to reevaluate for ongoing obstruction and/or regrowth  He is agreeable to plan - Urinalysis, Complete  2. Elevated PSA Extensive chart review today, long history of elevated PSA which may partially be related to underlying cystitis/possible prostatitis  We will plan to treat UTI again, repeat PSA once the infection has cleared prior to recommending any further diagnostic intervention for the time being  3. Acute cystitis without hematuria Urine remains frankly positive again today  On  previous occasion, he was treated with Macrobid with improvement in his urinary symptoms although this may have not been a long course for given that Macrobid does not have good tissue penetration, more complex UTI requiring alternative antibiotic.  Based on his previous culture, we will go ahead and call him in amoxicillin.  He does have a penicillin allergy although this is a rash that is low risk for cross-reactivity and/or reaction.  - CULTURE, URINE COMPREHENSIVE   Follow-up 6 weeks for UA/PSA/PVR/IPSS  Hollice Espy, MD  [facility] [address] [place], [place] [address] [phone]  I spent 30 total minutes on the day of the encounter including pre-visit review of the medical record, face-to-face time with the  patient, and post visit ordering of labs/imaging/tests.

## 2020-09-02 ENCOUNTER — Ambulatory Visit (INDEPENDENT_AMBULATORY_CARE_PROVIDER_SITE_OTHER): Payer: Medicare Other | Admitting: Urology

## 2020-09-02 ENCOUNTER — Other Ambulatory Visit: Payer: Self-pay

## 2020-09-02 VITALS — BP 127/84 | HR 76 | Ht 69.0 in | Wt 205.0 lb

## 2020-09-02 DIAGNOSIS — R972 Elevated prostate specific antigen [PSA]: Secondary | ICD-10-CM

## 2020-09-02 DIAGNOSIS — N3000 Acute cystitis without hematuria: Secondary | ICD-10-CM | POA: Diagnosis not present

## 2020-09-02 DIAGNOSIS — N401 Enlarged prostate with lower urinary tract symptoms: Secondary | ICD-10-CM

## 2020-09-02 DIAGNOSIS — R3914 Feeling of incomplete bladder emptying: Secondary | ICD-10-CM | POA: Diagnosis not present

## 2020-09-02 MED ORDER — AMOXICILLIN 875 MG PO TABS: 875.0000 mg | ORAL_TABLET | Freq: Two times a day (BID) | ORAL | 0 refills | Status: DC

## 2020-09-02 NOTE — Patient Instructions (Signed)
Will call you with Urine results

## 2020-09-03 ENCOUNTER — Telehealth: Payer: Self-pay

## 2020-09-03 NOTE — Telephone Encounter (Signed)
Patient called on call nurse last night stating he has an allergy to penicillin so he did not pick up amoxicillin rx. As per Dr. Erlene Quan , she believes it is safe to take Amoxicillin and there is a small chance to get a reaction. As per Dr. Erlene Quan if pt wants to wait for culture to get back prior to starting meds that is fine.    Spoke with patient. He will start the amoxicillin and if he gets any reaction he will DC medication and call office. Patient verbalized understanding.

## 2020-09-04 LAB — URINALYSIS, COMPLETE
Bilirubin, UA: NEGATIVE
Glucose, UA: NEGATIVE
Nitrite, UA: POSITIVE — AB
Specific Gravity, UA: 1.025 (ref 1.005–1.030)
Urobilinogen, Ur: 0.2 mg/dL (ref 0.2–1.0)
pH, UA: 7 (ref 5.0–7.5)

## 2020-09-04 LAB — MICROSCOPIC EXAMINATION: WBC, UA: >30 /hpf — AB (ref 0–5)

## 2020-09-05 LAB — CULTURE, URINE COMPREHENSIVE

## 2020-09-08 ENCOUNTER — Telehealth: Payer: Self-pay | Admitting: *Deleted

## 2020-09-08 NOTE — Telephone Encounter (Addendum)
Order [phone] sensitivities added on-results can take up to 3-8 days   ----- Message from Hollice Espy, MD sent at 09/08/2020 11:38 AM EDT ----- Can you please call the lab and see if they can add on sensitivities to this?  This is the second time he is growing this organism and I want a make sure that we are giving him the appropriate antibiotic.  Hollice Espy, MD

## 2020-10-15 NOTE — Progress Notes (Signed)
10/16/20 11:11 AM   Perry Bell September 19, 1950 [id number]  Referring provider:  Jonathon Jordan, MD [facility] [address] [place],  [place] [address] Chief Complaint  Patient presents with   Elevated PSA     HPI: Perry Bell is a 70 y.o.male with a personal history of elevated PSA a a complex GU history, who presets today for 6 week follow-up with UA, PSA, and PVR.   He has a GU history that includes BPH and urinary retention s/p greenlight laser in 2019.   He had a elevated PSA of 20.1 in 05/2017 prior to his prostate surgery in the setting of massive urinary retention with bilateral hydronephrosis. During which a prostate biopsy was performed. Documentation shows a negative prostate biopsy in 2012 at which time his PSA was 7.   PSA in 12/2019 was markedly elevated to 19.82 repeat screening in 06/2020 showed a rising level of 21.57.   Urine culture on 09/02/2020 grew Aerococcus species he previously grew this bacteria in 07/23/2020. Urinalysis showed  leukocytes, trace ketones, 2+ RBCs, and positive nitrite.   Urine today showed 11-30 WBC, 11-30 RBC, nitrite negative, and no bacteria.   He is doing well today. He reports that he has been losing weight. He was a little concerned about PVR today. He feels as though he flow is much better.   PSA trend  2012             7  12/2019        19.82 06/2020        21.57    PMH: Past Medical History:  Diagnosis Date   Elevated PSA    Enlarged prostate     Surgical History: Past Surgical History:  Procedure Laterality Date   CYST EXCISION  2005   from Left Testicle    GREEN LIGHT LASER TURP (TRANSURETHRAL RESECTION OF PROSTATE  2018   INGUINAL HERNIA REPAIR Right 10/26/2019   Procedure: RIGHT INGUINAL HERNIA REPAIR WITH MESH;  Surgeon: Robert Bellow, MD;  Location: [facility] OR[facility];  Service: General;  Laterality: Right;   UMBILICAL HERNIA REPAIR N/A 10/26/2019   Procedure: HERNIA REPAIR UMBILICAL ADULT;  Surgeon: Robert Bellow, MD;  Location: [facility] ORS;  Service: General;  Laterality: N/A;    Home Medications:  Allergies as of 10/16/2020       Reactions   Penicillins Rash        Medication List        Accurate as of October 16, 2020 11:11 AM. If you have any questions, ask your nurse or doctor.          STOP taking these medications    amoxicillin 875 MG tablet Commonly known as: AMOXIL Stopped by: Hollice Espy, MD       TAKE these medications    multivitamin with minerals Tabs tablet Take 1 tablet by mouth daily.   tamsulosin 0.4 MG Caps capsule Commonly known as: FLOMAX Take 0.4 mg by mouth in the morning and at bedtime.        Allergies:  Allergies  Allergen Reactions   Penicillins Rash    Family History: Family History  Problem Relation Age of Onset   Prostate cancer Maternal Grandfather     Social History:  reports that he has never smoked. He has never used smokeless tobacco. He reports that he does not drink alcohol and does not use drugs.   Physical Exam: BP 140/83   Pulse 93   Ht '5\' 10"'$  (  1.778 m)   Wt 198 lb (89.8 kg)   BMI 28.41 kg/m   Constitutional:  Alert and oriented, No acute distress. HEENT: [place] AT, moist mucus membranes.  Trachea midline, no masses. Cardiovascular: No clubbing, cyanosis, or edema. Respiratory: Normal respiratory effort, no increased work of breathing. Skin: No rashes, bruises or suspicious lesions. Neurologic: Grossly intact, no focal deficits, moving all 4 extremities. Psychiatric: Normal mood and affect.  Laboratory Data:  Lab Results  Component Value Date   CREATININE 1.20 10/24/2019    Urinalysis 1-30 WBC, 11-30 RBC, nitrite negative, and no bacteria.    Pertinent Imaging: Results for orders placed or performed in visit on 10/16/20  BLADDER SCAN AMB NON-IMAGING  Result Value Ref Range   Scan Result 363 ml    He is able to void and provide a sample just following his bladder scan and thus does not  reflective of a true PVR   Assessment & Plan:    Recurrent UTIs - Urinalysis today shows 11-30 WBC and RBC but otherwise unremarkable will repeat culture  -Due to the presence of persistently positive urinalysis 11 microscopic blood, recommend cystoscopy to rule out bladder pathology may consider imaging down the road  Elevated PSA  - PSA pending  -He has a longstanding history of elevated PSA however in the setting of infection, we opted to repeat his PSA.  Unfortunately, it remained elevated again with ongoing infection.  Urinalysis today is improved and thus will repeat PSA -If his PSA remains elevated, this point recommend pursuing prostate biopsy.  We discussed the risk and benefits specifically the risk of bacteremia and bleeding -If his PSA does remain elevated, he agrees to a biopsy   BPH with incomplete bladder emptying  - He reports that he has seen improvement in stream  - Continue Flomax    Will call with PSA results, may consider cystoscopy/prostate biopsy pending these  I,Kailey Littlejohn,acting as a scribe for Hollice Espy, MD.,have documented all relevant documentation on the behalf of Hollice Espy, MD,as directed by  Hollice Espy, MD while in the presence of Hollice Espy, MD.  I have reviewed the above documentation for accuracy and completeness, and I agree with the above.   Hollice Espy, MD    [facility] [address], [place] [employer], [place] [address] [phone]

## 2020-10-16 ENCOUNTER — Ambulatory Visit: Payer: Medicare Other | Admitting: Urology

## 2020-10-16 ENCOUNTER — Other Ambulatory Visit: Payer: Self-pay

## 2020-10-16 VITALS — BP 140/83 | HR 93 | Ht 70.0 in | Wt 198.0 lb

## 2020-10-16 DIAGNOSIS — R3914 Feeling of incomplete bladder emptying: Secondary | ICD-10-CM | POA: Diagnosis not present

## 2020-10-16 DIAGNOSIS — R972 Elevated prostate specific antigen [PSA]: Secondary | ICD-10-CM | POA: Diagnosis not present

## 2020-10-16 DIAGNOSIS — N401 Enlarged prostate with lower urinary tract symptoms: Secondary | ICD-10-CM

## 2020-10-16 LAB — BLADDER SCAN AMB NON-IMAGING: Scan Result: 363

## 2020-10-17 ENCOUNTER — Telehealth: Payer: Self-pay | Admitting: *Deleted

## 2020-10-17 LAB — MICROSCOPIC EXAMINATION: Bacteria, UA: NONE SEEN

## 2020-10-17 LAB — PSA: Prostate Specific Ag, Serum: 22.4 ng/mL — ABNORMAL HIGH (ref 0.0–4.0)

## 2020-10-17 LAB — URINALYSIS, COMPLETE
Bilirubin, UA: NEGATIVE
Glucose, UA: NEGATIVE
Ketones, UA: NEGATIVE
Nitrite, UA: NEGATIVE
Specific Gravity, UA: 1.015 (ref 1.005–1.030)
Urobilinogen, Ur: 0.2 mg/dL (ref 0.2–1.0)
pH, UA: 7 (ref 5.0–7.5)

## 2020-10-17 NOTE — Telephone Encounter (Addendum)
Patient advised-Biopsy/Cysto scheduled- mailed reminder and instructions reviewed.   ----- Message from Hollice Espy, MD sent at 10/17/2020  8:54 AM EDT ----- PSA still remains markedly elevated despite his urinalysis looking better.  I think it is time to bite the bullet and go ahead and schedule a cystoscopy and prostate biopsy.  This is what we had discussed in the office.  Please let him know and schedule.  Hollice Espy, MD

## 2020-10-24 LAB — CULTURE, URINE COMPREHENSIVE

## 2020-10-28 NOTE — Progress Notes (Signed)
10/29/20  CC:  Chief Complaint  Patient presents with   Prostate Biopsy      HPI: Perry Bell is a 70 y.o. male with a personal history of elevated PSA and a complex GU history, who presents today for a prostate biopsy and cystoscopy   His GU history consist of BPH and urinary retention. He is s/p greenlight laser in 2019.   His PSA has been rising since 2019. Prior to his prostate surgery was 20.1 in 05/2017. It was in the setting of massive urinary retention with bilateral hydronephrosis. A prostate biopsy was performed.   He has a negative prostate biopsy in 2012 at which his PSA was 7.   12/2019 PSA was 19.82 and continued to increase on repeat screening in 06/2020 which was at a level of 21.57.   PSA on 10/16/2020 had elevated to 22.4.   He is doing well today.   PSA trend  2012             7  12/2019        19.82 06/2020        21.57 10/2020        22.4   Vitals:   10/29/20 0951  BP: (!) 139/98  Pulse: 80  NED. A&Ox3.   No respiratory distress   Abd soft, NT, ND Normal external genitalia with patent urethral meatus    Cystoscopy Procedure Note  Patient identification was confirmed, informed consent was obtained, and patient was prepped using Betadine solution.  Lidocaine jelly was administered per urethral meatus.     Pre-Procedure: - Inspection reveals a normal caliber ureteral meatus.  Procedure: The flexible cystoscope was introduced without difficulty - No urethral strictures/lesions are present. - irregular nodularity of prostate, torturous fossa, significant asymmetry with right regrowth greater than left - Elevated bladder neck with bulbous edema and irregular nodularity - Bilateral ureteral orifices identified - Bladder mucosa  reveals no ulcers, tumors, or lesions - No bladder stones - mildly  trabeculated bladder   Retroflexion shows irregular extension of the prostate into the base of the bladder right grade and left.     Post-Procedure: - Patient tolerated the procedure well   Prostate Biopsy Procedure   Informed consent was obtained after discussing risks/benefits of the procedure.  A time out was performed to ensure correct patient identity.  Pre-Procedure: - Last PSA Level:  Component Prostate Specific Ag, Serum  Latest Ref Rng & Units 0.0 - 4.0 ng/mL  10/16/2020 22.4 (H)   - Gentamicin given prophylactically - Levaquin 500 mg administered PO -Transrectal Ultrasound performed revealing a 155 gm prostate -- Hypoechoic area in the right mid gland that was sampled   Procedure: - Prostate block performed using 10 cc 1% lidocaine and biopsies taken from sextant areas, a total of 12 under ultrasound guidance.  Post-Procedure: - Patient tolerated the procedure well - He was counseled to seek immediate medical attention if experiences any severe pain, significant bleeding, or fevers - Return in one week to discuss biopsy results  Assessment and Plan  Elevated PSA   As above 2. BPH with outlet obstruction  - will discuss the role of HoLEP he is a good candidate for this and will discuss further in his follow-up visit    I,Kailey Littlejohn,acting as a scribe for Hollice Espy, MD.,have documented all relevant documentation on the behalf of Hollice Espy, MD,as directed by  Hollice Espy, MD while in the presence of Hollice Espy, MD.  I have reviewed  the above documentation for accuracy and completeness, and I agree with the above.   Hollice Espy, MD

## 2020-10-29 ENCOUNTER — Other Ambulatory Visit: Payer: Self-pay

## 2020-10-29 ENCOUNTER — Ambulatory Visit: Payer: Medicare Other | Admitting: Urology

## 2020-10-29 VITALS — BP 139/98 | HR 80

## 2020-10-29 DIAGNOSIS — R972 Elevated prostate specific antigen [PSA]: Secondary | ICD-10-CM | POA: Diagnosis not present

## 2020-10-29 DIAGNOSIS — N3000 Acute cystitis without hematuria: Secondary | ICD-10-CM

## 2020-10-29 MED ORDER — LEVOFLOXACIN 500 MG PO TABS
500.0000 mg | ORAL_TABLET | Freq: Once | ORAL | Status: AC
  Administered 2020-10-29: 500 mg via ORAL

## 2020-10-29 MED ORDER — GENTAMICIN SULFATE 40 MG/ML IJ SOLN
80.0000 mg | Freq: Once | INTRAMUSCULAR | Status: AC
  Administered 2020-10-29: 80 mg via INTRAMUSCULAR

## 2020-10-30 LAB — MICROSCOPIC EXAMINATION: Bacteria, UA: NONE SEEN

## 2020-10-30 LAB — URINALYSIS, COMPLETE
Bilirubin, UA: NEGATIVE
Glucose, UA: NEGATIVE
Ketones, UA: NEGATIVE
Nitrite, UA: NEGATIVE
Specific Gravity, UA: 1.015 (ref 1.005–1.030)
Urobilinogen, Ur: 0.2 mg/dL (ref 0.2–1.0)
pH, UA: 7 (ref 5.0–7.5)

## 2020-10-31 LAB — SURGICAL PATHOLOGY

## 2020-11-04 NOTE — Progress Notes (Signed)
11/05/20 11:11 AM   Perry Bell 06/19/1950 [id number]  Referring provider:  No referring provider defined for this encounter. Chief Complaint  Patient presents with   Results     HPI: Perry Bell is a 70 y.o.male with a personal history of elevated PSA and a complex GU history, who presents today for prostate biopsy results.   His GU history consist of BPH and urinary rentention, he is s/p greenlight laser in 2019.   His PSA in 2019 have been steadily rising. Prior to surgery of his prostate his PSA was 20.1 in 05/2017. A prostate biopsy was performed and was negative.   PSA lowered to 19.82 in 12/2019 but then elevated to 21.57 in 06/2020/   PSA on 10/16/2020 had risen to 22.4.   Underwent prostate biopsy on 11/05/2020. Surgical pathology showed 1/1 cores with a Gleason 3+3 affecting 20%. On cystoscopy he was noted to have a markedly enlarged prostate.  TRUS vol 155 g with asymmetry  Surgical Pathology:  DIAGNOSIS:  Diagnostic Map    Benign  Atypical  Malignant  HGPIN      Diagnostic Summary   [A] PROSTATE, LEFT BASE:   NEGATIVE FOR MALIGNANCY. ATROPHIC CHANGES  ARE PRESENT.   [B] PROSTATE, LEFT MID:   NEGATIVE FOR MALIGNANCY. ATROPHIC CHANGES ARE  PRESENT.   [C] PROSTATE, LEFT APEX:   NEGATIVE FOR MALIGNANCY.  ATROPHIC CHANGES  ARE PRESENT.   [D] PROSTATE, RIGHT BASE:   NEGATIVE FOR MALIGNANCY. ATROPHIC CHANGES  ARE PRESENT.   [E] PROSTATE, RIGHT MID:   NEGATIVE FOR MALIGNANCY. ATROPHIC CHANGES  ARE PRESENT.   [F] PROSTATE, RIGHT APEX:   NEGATIVE FOR MALIGNANCY. ATROPHIC CHANGES  ARE PRESENT.   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 ATROPHIC CHANGES  ARE PRESENT.   [H] PROSTATE, LEFT LATERAL MID:   NEGATIVE FOR MALIGNANCY. ATROPHIC  CHANGES ARE PRESENT.   [I] PROSTATE, LEFT LATERAL APEX:   NEGATIVE FOR MALIGNANCY. ATROPHIC  CHANGES ARE PRESENT.   [J] PROSTATE, RIGHT LATERAL  BASE:   NEGATIVE FOR MALIGNANCY. ATROPHIC  CHANGES ARE PRESENT.   [K] PROSTATE, RIGHT LATERAL MID:   NEGATIVE FOR MALIGNANCY. ATROPHIC  CHANGES ARE PRESENT.   [L] PROSTATE, RIGHT LATERAL APEX:   NEGATIVE FOR MALIGNANCY. ATROPHIC  CHANGES ARE PRESENT.     PMH: Past Medical History:  Diagnosis Date   Elevated PSA    Enlarged prostate     Surgical History: Past Surgical History:  Procedure Laterality Date   CYST EXCISION  2005   from Left Testicle    GREEN LIGHT LASER TURP (TRANSURETHRAL RESECTION OF PROSTATE  2018   INGUINAL HERNIA REPAIR Right 10/26/2019   Procedure: RIGHT INGUINAL HERNIA REPAIR WITH MESH;  Surgeon: Robert Bellow, MD;  Location: [facility];  Service: General;  Laterality: Right;   UMBILICAL HERNIA REPAIR N/A 10/26/2019   Procedure: HERNIA REPAIR UMBILICAL ADULT;  Surgeon: Robert Bellow, MD;  Location: [facility];  Service: General;  Laterality: N/A;    Home Medications:  Allergies as of 11/05/2020       Reactions   Penicillins Rash        Medication List        Accurate as of November 05, 2020 11:11 AM. If you have any questions, ask your nurse or doctor.          multivitamin with minerals Tabs tablet Take 1 tablet by mouth daily.  tamsulosin 0.4 MG Caps capsule Commonly known as: FLOMAX Take 0.4 mg by mouth in the morning and at bedtime.        Allergies:  Allergies  Allergen Reactions   Penicillins Rash    Family History: Family History  Problem Relation Age of Onset   Prostate cancer Maternal Grandfather     Social History:  reports that he has never smoked. He has never used smokeless tobacco. He reports that he does not drink alcohol and does not use drugs.   Physical Exam: BP (!) 140/93   Pulse 83   Ht 5\' 10"  (1.778 m)   Wt 198 lb (89.8 kg)   BMI 28.41 kg/m   Constitutional:  Alert and oriented, No acute distress. HEENT: [place] AT, moist mucus membranes.  Trachea midline, no masses. Cardiovascular: No clubbing,  cyanosis, or edema. Respiratory: Normal respiratory effort, no increased work of breathing. Skin: No rashes, bruises or suspicious lesions. Neurologic: Grossly intact, no focal deficits, moving all 4 extremities. Psychiatric: Normal mood and affect.  Laboratory Data:  Lab Results  Component Value Date   CREATININE 1.20 10/24/2019     Assessment & Plan:   Prostate cancer  - The patient was counseled about the natural history of prostate cancer and the standard treatment options that are available for prostate cancer. It was explained to him how his age and life expectancy, clinical stage, Gleason score, and PSA affect his prognosis, the decision to proceed with additional staging studies, as well as how that information influences recommended treatment strategies. We discussed the roles for active surveillance, surgical therapy, as well as ablative therapy options for the treatment of prostate cancer as appropriate to his individual cancer situation. We discussed the risks and benefits of these options with regard to their impact on cancer control and also in terms of potential adverse events, complications, and impact on quality of life particularly related to urinary, bowel, and sexual function. The patient was encouraged to ask questions throughout the discussion today and all questions were answered to his stated satisfaction. In addition, the patient was provided with and/or directed to appropriate resources and literature for further education about prostate cancer treatment options.  - Recommend active surveillance  -Based on the large prostate size and irregularity, I do think that pursuing prostate MRI 6 months from now would be helpful to ensure that there is no evidence sampling error.  He is agreeable this plan.  2. BPH with outlet obstruction  - Discussed the role of HoLEP he is a good candidate for this   - We reviewed the surgery in detail today including the preoperative,  intraoperative, and postoperative course.  This will most likely be an outpatient procedure pending the degree of post op hematuria.  He will go home with catheter for a few days post op and will either be taught how to remove his own catheter or return to the office for catheter removal.  Risk of bleeding, infection, damage surrounding structures, injury to the bladder/ urethral, bladder neck contracture, ureteral stricture, retrograde ejaculation, stress/ urge incontinence, exacerbation of irritative voiding symptoms were all discussed in detail.    - Finasteride was offered but he declined.   -We will likely want to move forward with HoLEP in the future but would like to wait until early spring due to some upcoming commitments   Follow-up 6 months for PSA/prostate MRI  I,Kailey Littlejohn,acting as a scribe for Hollice Espy, MD.,have documented all relevant documentation on the behalf of  Hollice Espy, MD,as directed by  Hollice Espy, MD while in the presence of Hollice Espy, [place] [address] [place], [place] [address] [phone]  I spent 45 total minutes on the day of the encounter including pre-visit review of the medical record, face-to-face time with the patient, and post visit ordering of labs/imaging/tests.

## 2020-11-05 ENCOUNTER — Encounter: Payer: Self-pay | Admitting: Urology

## 2020-11-05 ENCOUNTER — Other Ambulatory Visit: Payer: Self-pay

## 2020-11-05 ENCOUNTER — Ambulatory Visit: Payer: Medicare Other | Admitting: Urology

## 2020-11-05 VITALS — BP 140/93 | HR 83 | Ht 70.0 in | Wt 198.0 lb

## 2020-11-05 DIAGNOSIS — R972 Elevated prostate specific antigen [PSA]: Secondary | ICD-10-CM

## 2021-04-30 ENCOUNTER — Telehealth: Payer: Self-pay | Admitting: Urology

## 2021-04-30 ENCOUNTER — Other Ambulatory Visit: Payer: Medicare Other

## 2021-04-30 ENCOUNTER — Other Ambulatory Visit: Payer: Self-pay

## 2021-04-30 DIAGNOSIS — R972 Elevated prostate specific antigen [PSA]: Secondary | ICD-10-CM

## 2021-04-30 NOTE — Telephone Encounter (Signed)
Pt was here for labs this morning and wants to know if he can get an antibiotic so he can start self cathing??? ?

## 2021-05-01 LAB — PSA: Prostate Specific Ag, Serum: 22.6 ng/mL — ABNORMAL HIGH (ref 0.0–4.0)

## 2021-05-01 NOTE — Telephone Encounter (Signed)
Spoke with patient, he has been feeling like he has not been emptying his bladder when he voids. His previous urologist who performed greenlight laser ablation mentioned self-cathing was wanting to know if he should be doing this if he has been feeling this way. Denies any current urinary symptoms or pain, or difficulty urinating. Explained and reviewed in detail prior visit from 11/05/20. He is scheduled for a follow up 05/12/21. Offered appointment for PVR to scan his bladder, patient declined. Will keep follow up to discuss further. ?

## 2021-05-04 ENCOUNTER — Ambulatory Visit
Admission: RE | Admit: 2021-05-04 | Discharge: 2021-05-04 | Disposition: A | Discharge status: Home / Self Care | Payer: Medicare Other | Source: Ambulatory Visit | Attending: Urology | Admitting: Urology

## 2021-05-04 DIAGNOSIS — R972 Elevated prostate specific antigen [PSA]: Secondary | ICD-10-CM | POA: Diagnosis present

## 2021-05-04 MED ORDER — GADOBUTROL 1 MMOL/ML IV SOLN
8.0000 mL | Freq: Once | INTRAVENOUS | Status: AC | PRN
  Administered 2021-05-04: 8 mL via INTRAVENOUS

## 2021-05-05 ENCOUNTER — Other Ambulatory Visit: Payer: Medicare Other

## 2021-05-05 ENCOUNTER — Ambulatory Visit: Payer: Medicare Other | Admitting: Urology

## 2021-05-11 NOTE — H&P (View-Only) (Signed)
? ?05/12/21 ?8:44 AM  ? ?Perry Bell ?1950/10/02 ?[id number] ? ?Referring provider:  ?No referring provider defined for this encounter. ?Chief Complaint  ?Patient presents with  ? Benign Prostatic Hypertrophy  ? ? ? ?HPI: ?Perry Bell is a 71 y.o.male with a personal history of prostate cancer and BPH with outlet obstruction, who presents today for a 6 month follow-up with results with IPSS, PVR, and prior PSA and prostate MRI results.  ? ?He is s/p greenlight laser in 2019. His PSA at the time had been steadily rising with a prior PSA of 20.1 in 05/2017. He underwent a prostate biopsy that was negative. ? ?His PSA continued to fluctuate/elevate with a PSA of 19.82 in 12/2019 that elevated to a PSA of 22.4 on 10/16/2020. He underwent a prostate biopsy on 11/05/2020 that was consistent with Gleason 3+3 involving 1/1 cores affecting 20%. TRUS 155 gm.  ? ?His most recent PSA was 22.6 on 04/30/2021.  ? ?He elected active surveillance and underwent a prostate MRI on  05/04/2021 that visualized no high-grade carcinoma within the thin peripheral zone. PI-RADS:2. It also visualized an enlarged nodular transitional zone most consistent with BPH, and prostatomegaly.  ? ?He reports that he has a history of urinary retention with as much as 1000 ml.  She has an elevated PVR today in the 400 range.  He does think his urinary symptoms have progressively gotten worse.  He now feels like he is not able to empty sufficiently and is motivated to proceed with intervention. ? ?PSA trend:  ? Prostate Specific Ag, Serum  ?Latest Ref Rng 0.0 - 4.0 ng/mL  ?05/2017 20.1 (H)  ?12/2019 19.82 (H)  ?06/2020 21.57 (H)  ?10/16/2020 22.4 (H)   ?04/30/2021 22.6 (H)   ?  ?(H) High ? ? ?Results for orders placed or performed in visit on 05/12/21  ?Bladder Scan (Post Void Residual) in office  ?Result Value Ref Range  ? Scan Result 427   ? ? ? ? ?PMH: ?Past Medical History:  ?Diagnosis Date  ? Elevated PSA   ? Enlarged prostate   ? ? ?Surgical History: ?Past  Surgical History:  ?Procedure Laterality Date  ? CYST EXCISION  2005  ? from Left Testicle   ? GREEN LIGHT LASER TURP (TRANSURETHRAL RESECTION OF PROSTATE  2018  ? INGUINAL HERNIA REPAIR Right 10/26/2019  ? Procedure: RIGHT INGUINAL HERNIA REPAIR WITH MESH;  Surgeon: Robert Bellow, MD;  Location: [facility];  Service: General;  Laterality: Right;  ? UMBILICAL HERNIA REPAIR N/A 10/26/2019  ? Procedure: HERNIA REPAIR UMBILICAL ADULT;  Surgeon: Robert Bellow, MD;  Location: [facility];  Service: General;  Laterality: N/A;  ? ? ?Home Medications:  ?Allergies as of 05/12/2021   ? ?   Reactions  ? Penicillins Rash  ? ?  ? ?  ?Medication List  ?  ? ?  ? Accurate as of May 12, 2021  8:44 AM. If you have any questions, ask your nurse or doctor.  ?  ?  ? ?  ? ?multivitamin with minerals Tabs tablet ?Take 1 tablet by mouth daily. ?  ?tamsulosin 0.4 MG Caps capsule ?Commonly known as: FLOMAX ?Take 0.4 mg by mouth in the morning and at bedtime. ?  ? ?  ? ? ?Allergies:  ?Allergies  ?Allergen Reactions  ? Penicillins Rash  ? ? ?Family History: ?Family History  ?Problem Relation Age of Onset  ? Prostate cancer Maternal Grandfather   ? ? ?Social History:  reports that he  has never smoked. He has never used smokeless tobacco. He reports that he does not drink alcohol and does not use drugs. ? ? ?Physical Exam: ?BP (!) 163/102   Pulse 83   Ht '5\' 10"'$  (1.778 m)   Wt 198 lb (89.8 kg)   BMI 28.41 kg/m?   ?Constitutional:  Alert and oriented, No acute distress. ?HEENT: [place] AT, moist mucus membranes.  Trachea midline, no masses. ?Cardiovascular: No clubbing, cyanosis, or edema. ?Respiratory: Normal respiratory effort, no increased work of breathing. ?Skin: No rashes, bruises or suspicious lesions. ?Neurologic: Grossly intact, no focal deficits, moving all 4 extremities. ?Psychiatric: Normal mood and affect. ? ?Laboratory Data: ? ?Lab Results  ?Component Value Date  ? CREATININE 1.20 10/24/2019  ? ? ?Pertinent Imaging: ?CLINICAL DATA:   71 year old with Gleason 3+3=6 adenocarcinoma the ?LEFT lateral base. PSA equal 22 ?  ?EXAM: ?MR PROSTATE WITHOUT AND WITH CONTRAST ?  ?TECHNIQUE: ?Multiplanar multisequence MRI images were obtained of the pelvis ?centered about the prostate. Pre and post contrast images were ?obtained. ?  ?CONTRAST:  60m GADAVIST GADOBUTROL 1 MMOL/ML IV SOLN ?  ?COMPARISON:  None. ?  ?FINDINGS: ?Prostate: The peripheral zone is thinned by the enlarged ?transitional zone. There is no focal lesion on T2 weighted imaging ?within the peripheral zone (series 5). Mild heterogeneity of signal ?intensity within the peripheral zone. ?  ?There is no foci of restricted diffusion within the peripheral zone. ?(Series 7). ?  ?The transitional zone is enlarged by capsulated nodules without ?suspicious imaging characteristics on T2 weighted imaging. ?  ?No suspicious post contrast findings. ?  ?Volume: 7.1 x 6.4 x 6.7 (volume = 160) ?  ?Transcapsular spread:  Absent ?  ?Seminal vesicle involvement: Absent ?  ?Neurovascular bundle involvement: Absent ?  ?Pelvic adenopathy: Absent ?  ?Bone metastasis: Absent ?  ?Other findings: None ?  ?IMPRESSION: ?1. No high-grade carcinoma identified within the thin peripheral ?zone. PI-RADS: 2 ?2. Enlarged nodular transitional zone most consistent with benign ?prostate hypertrophy. PI-RADS: 2. ?3. Prostatomegaly. ?  ?  ?Electronically Signed ?  By: SSuzy BouchardM.D. ?  On: 05/05/2021 10:55 ? ? ?I have personally reviewed the images and agree with radiologist interpretation.  ? ? ?Results for orders placed or performed in visit on 05/12/21  ?Bladder Scan (Post Void Residual) in office  ?Result Value Ref Range  ? Scan Result 427   ? ?Assessment & Plan:   ?BPH with chronic retention  ?- He is not emptying adequately today with a PVR of 427 ml today.  ? ?- We discussed various outlet procedures such a HoLEP versus TURP versus Urolift. He is a great candidate for HoLEP.  ? ?- We reviewed HoLEP in detail today  including the preoperative, intraoperative, and postoperative course.  This will most likely be an outpatient procedure pending the degree of post op hematuria.  He will go home with catheter for a few days post op and will either be taught how to remove his own catheter or return to the office for catheter removal. ? ?- Risk of bleeding, infection, damage surrounding structures, injury to the bladder/ urethral, bladder neck contracture, ureteral stricture, retrograde ejaculation, stress/ urge incontinence, exacerbation of irritative voiding symptoms were all discussed in detail.  He would like to proceed with HoLEP.  ? ?- Will send urine for pre-op culture.  ? ?- He was given a handout about pelvic floor exercises today ? ?2.  Prostate cancer ?Incidental Gleason 3+3 prostate cancer on active  surveillance, PSA remains stable and MRI is reassuring.  We will proceed with outlet procedure as above.  He understands that we will send specimen for surgical pathology. ? ? ? ?I,Kailey Littlejohn,acting as a scribe for Hollice Espy, MD.,have documented all relevant documentation on the behalf of Hollice Espy, MD,as directed by  Hollice Espy, MD while in the presence of Hollice Espy, MD. ? ?I have reviewed the above documentation for accuracy and completeness, and I agree with the above.  ? ?Hollice Espy, MD ? ? ?[facility] ?[address], [address] ?[place], [place] [address] ?[phone] ?

## 2021-05-11 NOTE — Progress Notes (Signed)
? ?05/12/21 ?8:44 AM  ? ?Perry Bell ?09/13/1950 ?[id number] ? ?Referring provider:  ?No referring provider defined for this encounter. ?Chief Complaint  ?Patient presents with  ? Benign Prostatic Hypertrophy  ? ? ? ?HPI: ?Perry Bell is a 71 y.o.male with a personal history of prostate cancer and BPH with outlet obstruction, who presents today for a 6 month follow-up with results with IPSS, PVR, and prior PSA and prostate MRI results.  ? ?He is s/p greenlight laser in 2019. His PSA at the time had been steadily rising with a prior PSA of 20.1 in 05/2017. He underwent a prostate biopsy that was negative. ? ?His PSA continued to fluctuate/elevate with a PSA of 19.82 in 12/2019 that elevated to a PSA of 22.4 on 10/16/2020. He underwent a prostate biopsy on 11/05/2020 that was consistent with Gleason 3+3 involving 1/1 cores affecting 20%. TRUS 155 gm.  ? ?His most recent PSA was 22.6 on 04/30/2021.  ? ?He elected active surveillance and underwent a prostate MRI on  05/04/2021 that visualized no high-grade carcinoma within the thin peripheral zone. PI-RADS:2. It also visualized an enlarged nodular transitional zone most consistent with BPH, and prostatomegaly.  ? ?He reports that he has a history of urinary retention with as much as 1000 ml.  She has an elevated PVR today in the 400 range.  He does think his urinary symptoms have progressively gotten worse.  He now feels like he is not able to empty sufficiently and is motivated to proceed with intervention. ? ?PSA trend:  ? Prostate Specific Ag, Serum  ?Latest Ref Rng 0.0 - 4.0 ng/mL  ?05/2017 20.1 (H)  ?12/2019 19.82 (H)  ?06/2020 21.57 (H)  ?10/16/2020 22.4 (H)   ?04/30/2021 22.6 (H)   ?  ?(H) High ? ? ?Results for orders placed or performed in visit on 05/12/21  ?Bladder Scan (Post Void Residual) in office  ?Result Value Ref Range  ? Scan Result 427   ? ? ? ? ?PMH: ?Past Medical History:  ?Diagnosis Date  ? Elevated PSA   ? Enlarged prostate   ? ? ?Surgical History: ?Past  Surgical History:  ?Procedure Laterality Date  ? CYST EXCISION  2005  ? from Left Testicle   ? GREEN LIGHT LASER TURP (TRANSURETHRAL RESECTION OF PROSTATE  2018  ? INGUINAL HERNIA REPAIR Right 10/26/2019  ? Procedure: RIGHT INGUINAL HERNIA REPAIR WITH MESH;  Surgeon: Robert Bellow, MD;  Location: [facility];  Service: General;  Laterality: Right;  ? UMBILICAL HERNIA REPAIR N/A 10/26/2019  ? Procedure: HERNIA REPAIR UMBILICAL ADULT;  Surgeon: Robert Bellow, MD;  Location: [facility];  Service: General;  Laterality: N/A;  ? ? ?Home Medications:  ?Allergies as of 05/12/2021   ? ?   Reactions  ? Penicillins Rash  ? ?  ? ?  ?Medication List  ?  ? ?  ? Accurate as of May 12, 2021  8:44 AM. If you have any questions, ask your nurse or doctor.  ?  ?  ? ?  ? ?multivitamin with minerals Tabs tablet ?Take 1 tablet by mouth daily. ?  ?tamsulosin 0.4 MG Caps capsule ?Commonly known as: FLOMAX ?Take 0.4 mg by mouth in the morning and at bedtime. ?  ? ?  ? ? ?Allergies:  ?Allergies  ?Allergen Reactions  ? Penicillins Rash  ? ? ?Family History: ?Family History  ?Problem Relation Age of Onset  ? Prostate cancer Maternal Grandfather   ? ? ?Social History:  reports that he  has never smoked. He has never used smokeless tobacco. He reports that he does not drink alcohol and does not use drugs. ? ? ?Physical Exam: ?BP (!) 163/102   Pulse 83   Ht '5\' 10"'$  (1.778 m)   Wt 198 lb (89.8 kg)   BMI 28.41 kg/m?   ?Constitutional:  Alert and oriented, No acute distress. ?HEENT: [place] AT, moist mucus membranes.  Trachea midline, no masses. ?Cardiovascular: No clubbing, cyanosis, or edema. ?Respiratory: Normal respiratory effort, no increased work of breathing. ?Skin: No rashes, bruises or suspicious lesions. ?Neurologic: Grossly intact, no focal deficits, moving all 4 extremities. ?Psychiatric: Normal mood and affect. ? ?Laboratory Data: ? ?Lab Results  ?Component Value Date  ? CREATININE 1.20 10/24/2019  ? ? ?Pertinent Imaging: ?CLINICAL DATA:   71 year old with Gleason 3+3=6 adenocarcinoma the ?LEFT lateral base. PSA equal 22 ?  ?EXAM: ?MR PROSTATE WITHOUT AND WITH CONTRAST ?  ?TECHNIQUE: ?Multiplanar multisequence MRI images were obtained of the pelvis ?centered about the prostate. Pre and post contrast images were ?obtained. ?  ?CONTRAST:  42m GADAVIST GADOBUTROL 1 MMOL/ML IV SOLN ?  ?COMPARISON:  None. ?  ?FINDINGS: ?Prostate: The peripheral zone is thinned by the enlarged ?transitional zone. There is no focal lesion on T2 weighted imaging ?within the peripheral zone (series 5). Mild heterogeneity of signal ?intensity within the peripheral zone. ?  ?There is no foci of restricted diffusion within the peripheral zone. ?(Series 7). ?  ?The transitional zone is enlarged by capsulated nodules without ?suspicious imaging characteristics on T2 weighted imaging. ?  ?No suspicious post contrast findings. ?  ?Volume: 7.1 x 6.4 x 6.7 (volume = 160) ?  ?Transcapsular spread:  Absent ?  ?Seminal vesicle involvement: Absent ?  ?Neurovascular bundle involvement: Absent ?  ?Pelvic adenopathy: Absent ?  ?Bone metastasis: Absent ?  ?Other findings: None ?  ?IMPRESSION: ?1. No high-grade carcinoma identified within the thin peripheral ?zone. PI-RADS: 2 ?2. Enlarged nodular transitional zone most consistent with benign ?prostate hypertrophy. PI-RADS: 2. ?3. Prostatomegaly. ?  ?  ?Electronically Signed ?  By: SSuzy BouchardM.D. ?  On: 05/05/2021 10:55 ? ? ?I have personally reviewed the images and agree with radiologist interpretation.  ? ? ?Results for orders placed or performed in visit on 05/12/21  ?Bladder Scan (Post Void Residual) in office  ?Result Value Ref Range  ? Scan Result 427   ? ?Assessment & Plan:   ?BPH with chronic retention  ?- He is not emptying adequately today with a PVR of 427 ml today.  ? ?- We discussed various outlet procedures such a HoLEP versus TURP versus Urolift. He is a great candidate for HoLEP.  ? ?- We reviewed HoLEP in detail today  including the preoperative, intraoperative, and postoperative course.  This will most likely be an outpatient procedure pending the degree of post op hematuria.  He will go home with catheter for a few days post op and will either be taught how to remove his own catheter or return to the office for catheter removal. ? ?- Risk of bleeding, infection, damage surrounding structures, injury to the bladder/ urethral, bladder neck contracture, ureteral stricture, retrograde ejaculation, stress/ urge incontinence, exacerbation of irritative voiding symptoms were all discussed in detail.  He would like to proceed with HoLEP.  ? ?- Will send urine for pre-op culture.  ? ?- He was given a handout about pelvic floor exercises today ? ?2.  Prostate cancer ?Incidental Gleason 3+3 prostate cancer on active  surveillance, PSA remains stable and MRI is reassuring.  We will proceed with outlet procedure as above.  He understands that we will send specimen for surgical pathology. ? ? ? ?I,Kailey Littlejohn,acting as a scribe for Hollice Espy, MD.,have documented all relevant documentation on the behalf of Hollice Espy, MD,as directed by  Hollice Espy, MD while in the presence of Hollice Espy, MD. ? ?I have reviewed the above documentation for accuracy and completeness, and I agree with the above.  ? ?Hollice Espy, MD ? ? ?[facility] ?[address] ?[place], [place] [address] ?[phone] ?

## 2021-05-12 ENCOUNTER — Ambulatory Visit: Payer: Medicare Other | Admitting: Urology

## 2021-05-12 ENCOUNTER — Telehealth: Payer: Self-pay

## 2021-05-12 ENCOUNTER — Other Ambulatory Visit: Payer: Self-pay | Admitting: Urology

## 2021-05-12 ENCOUNTER — Encounter: Payer: Self-pay | Admitting: Urology

## 2021-05-12 VITALS — BP 163/102 | HR 83 | Ht 70.0 in | Wt 198.0 lb

## 2021-05-12 DIAGNOSIS — R3914 Feeling of incomplete bladder emptying: Secondary | ICD-10-CM

## 2021-05-12 DIAGNOSIS — N401 Enlarged prostate with lower urinary tract symptoms: Secondary | ICD-10-CM

## 2021-05-12 DIAGNOSIS — N138 Other obstructive and reflux uropathy: Secondary | ICD-10-CM

## 2021-05-12 LAB — BLADDER SCAN AMB NON-IMAGING: Scan Result: 427

## 2021-05-12 NOTE — Patient Instructions (Addendum)
Holmium Laser Enucleation of the Prostate (HoLEP) ? ?HoLEP is a treatment for men with benign prostatic hyperplasia (BPH). The laser surgery removed blockages of urine flow, and is done without any incisions on the body. ? ? ?  ?What is HoLEP?  ?HoLEP is a type of laser surgery used to treat obstruction (blockage) of urine flow as a result of benign prostatic hyperplasia (BPH). In men with BPH, the prostate gland is not cancerous, but has become enlarged. An enlarged prostate can result in a number of urinary tract symptoms such as weak urinary stream, difficulty in starting urination, inability to urinate, frequent urination, or getting up at night to urinate. ? ?HoLEP was developed in the 1990's as a more effective and less expensive surgical option for BPH, compared to other surgical options such as laser vaporization(PVP/greenlight laser), transurethral resection of the prostate(TURP), and open simple prostatectomy.  ? ?What happens during a HoLEP? ?? HoLEP requires general anesthesia (?asleep? throughout the procedure).  ?? An antibiotic is given to reduce the risk of infection ?? A surgical instrument called a resectoscope is inserted through the urethra (the tube that carries urine from the bladder). The resectoscope has a camera that allows the surgeon to view the internal structure of the prostate gland, and to see where the incisions are being made during surgery. ?? The laser is inserted into the resectoscope and is used to enucleate (free up) the enlarged prostate tissue from the capsule (outer shell) and then to seal up any blood vessels. The tissue that has been removed is pushed back into the bladder. ?? A morcellator is placed through the resectoscope, and is used to suction out the prostate tissue that has been pushed into the bladder. ?? When the prostate tissue has been removed, the resectoscope is removed, and a foley catheter is placed to allow healing and drain the urine from the  bladder. ?  ? ? ?What happens after a HoLEP? ?? More than 90% of patients go home the same day a few hours after surgery. Less than 10% will be admitted to the hospital overnight for observation to monitor the urine, or if they have other medical problems. ?? Fluid is flushed through the catheter for about 1 hour after surgery to clear any blood from the urine. It is normal to have some blood in the urine after surgery. The need for blood transfusion is extremely rare. ?? Eating and drinking are permitted after the procedure once the patient has fully awakened from anesthesia. ?? The catheter is usually removed 2-3 days after surgery- the patient will come to clinic to have the catheter removed and make sure they can urinate on their own. ?? It is very important to drink lots of fluids after surgery for one week to keep the bladder flushed. ?? At first, there may be some burning with urination, but this typically improved within a few hours to days. Most patients do not have a significant amount of pain, and narcotic pain medications are rarely needed. ?? Symptoms of urinary frequency, urgency, and even leakage are NORMAL for the first few weeks after surgery as the bladder adjusts after having to work hard against blockage from the prostate for many years. This will improve, but can sometimes take several months. ?? The use of pelvic floor exercises (Kegel exercises) can help improve problems with urinary incontinence.  ?? After catheter removal, patients will be seen at 6 weeks and 6 months for symptom check ?? No heavy lifting for  at least 2-3 weeks after surgery, however patients can walk and do light activities the first day after surgery. Return to work time depends on occupation. ? ? ? ?What are the advantages of HoLEP? ?? HoLEP has been studied in many different parts of the world and has been shown to be a safe and effective procedure. Although there are many types of BPH surgeries available, HoLEP offers a  unique advantage in being able to remove a large amount of tissue without any incisions on the body, even in very large prostates, while decreasing the risk of bleeding and providing tissue for pathology (to look for cancer). This decreases the need for blood transfusions during surgery, minimizes hospital stay, and reduces the risk of needing repeat treatment. ? ?What are the side effects of HoLEP? ?? Temporary burning and bleeding during urination. Some blood may be seen in the urine for weeks after surgery and is part of the healing process. ?? Urinary incontinence (inability to control urine flow) is expected in all patients immediately after surgery and they should wear pads for the first few days/weeks. This typically improves over the course of several weeks. Performing Kegel exercises can help decrease leakage from stress maneuvers such as coughing, sneezing, or lifting. The rate of long term leakage is very low. Patients may also have leakage with urgency and this may be treated with medication. The risk of urge incontinence can be dependent on several factors including age, prostate size, symptoms, and other medical problems. ?? Retrograde ejaculation or ?backwards ejaculation.? In 75% of cases, the patient will not see any fluid during ejaculation after surgery. ?? Erectile function is generally not significantly affected.  ? ?What are the risks of HoLEP? ?? Injury to the urethra or development of scar tissue at a later date ?? Injury to the capsule of the prostate (typically treated with longer catheterization). ?? Injury to the bladder or ureteral orifices (where the urine from the kidney drains out) ?? Infection of the bladder, testes, or kidneys ?? Return of urinary obstruction at a later date requiring another operation (<2%) ?? Need for blood transfusion or re-operation due to bleeding ?? Failure to relieve all symptoms and/or need for prolonged catheterization after surgery ?? 5-15% of patients are  found to have previously undiagnosed prostate cancer in their specimen. Prostate cancer can be treated after HoLEP. ?? Standard risks of anesthesia including blood clots, heart attacks, etc ? ?When should I call my doctor? ?? Fever over 101.3 degrees ?? Inability to urinate, or large blood clots in the urine  ? ?The Male Pelvic Floor Muscles ? ?The pelvic floor consists of several layers of muscles that cover the bottom of the pelvic cavity. These muscles have several distinct roles: ? ?To support the pelvic organs, the bladder and colon within the pelvis. ?To assist in stopping and starting the flow of urine or the passage of gas or stool. ?To aid in sexual appreciation. ? ? ? ?How to Locate the Pelvic Floor Muscles ? ?The Urine Stop Test ?At the midstream of your urine flow, squeeze the pelvic floor muscles. You should feel the sensation of the openings close and the muscles pulling the penis and anus up and in to the pelvic cavity.  If you have strong muscles you will slow or stop the stream of urine. ?Try to stop or slow the flow of urine without tensing the muscles of your legs, buttocks. ?Do this only to locate the muscles, not as a daily exercise. ?Feeling  the Muscle ?Place a fingertip on or into the rectal opening.  Contract and lift the muscles as though you were holding back gas or a bowel movement.   ?You will feel your anal opening tighten and your penis move slightly. ?Watching the Muscles Contract ?Begin by lying on a flat surface.  Position yourself with your knees apart and bent with your head elevated and supported on several pillows.  Use a mirror to look at the anal opening and penis.  ?Contract or tighten the muscles around the anal opening and watch for a puckering and lifting of the anus and slight movement of the penis.   ?If you see a bulge of your anus this is an incorrect contraction and you should notify your health care provider for more instructions. ? ?? 2007, [employer]  Doc.12 ? ?

## 2021-05-12 NOTE — Progress Notes (Signed)
Surgical Physician Order Form [facility] [place] ? ?* Scheduling expectation : Next Available ? ?*Length of Case:  ? ?*Clearance needed: no ? ?*Anticoagulation Instructions: Hold all anticoagulants ? ?*Aspirin Instructions: N/A ? ?*Post-op visit Date/Instructions:   2 days foley removal, 6 weeks IPSS/ PVR ? ?*Diagnosis: BPH w/urinary obstruction ? ?*Procedure:     HOLEP (72094) ? ? ?Additional orders: N/A ? ?-Admit type: OUTpatient ? ?-Anesthesia: General ? ?-VTE Prophylaxis Standing Order SCD?s    ?   ?Other:  ? ?-Standing Lab Orders Per Anesthesia   ? ?Lab other:  ? ?-Standing Test orders EKG/Chest x-ray per Anesthesia      ? ?Test other:  ? ?- Medications:  Ancef 2gm IV  (ok with allergy) ? ?-Other orders:  N/A ? ? ? ?  ? ?

## 2021-05-12 NOTE — Telephone Encounter (Signed)
I spoke with Perry Bell. We have discussed possible surgery dates and Monday April 17th, 2023 was agreed upon by all parties. Patient given information about surgery date, what to expect pre-operatively and post operatively.  ?We discussed that a Pre-Admission Testing office will be calling to set up the pre-op visit that will take place prior to surgery, and that these appointments are typically done over the phone with a Pre-Admissions RN.  ?Informed patient that our office will communicate any additional care to be provided after surgery. Patients questions or concerns were discussed during our call. Advised to call our office should there be any additional information, questions or concerns that arise. Patient verbalized understanding.  ? ?

## 2021-05-12 NOTE — Progress Notes (Signed)
[facility] Posting Form  ? ?Surgery Date/Time: Date: 05/25/2021 ? ?Surgeon: Dr. Hollice Espy, MD ? ?Surgery Location: Day Surgery ? ?Inpt ( No  )   Outpt (Yes)   Obs ( No  )  ? ?Diagnosis: Benign Prostatic Hyperplasia with Urinary Obstruction N40.1, N13.8 ? ?-CPT: 67544 ? ?Surgery: Holmium Laser Enucleation of the Prostate ? ?Stop Anticoagulations: Yes ? ?Cardiac/Medical/Pulmonary Clearance needed: no ? ?*Orders entered into EPIC  Date: 05/12/21  ? ?*Case booked in [place]  Date: 05/12/21 ? ?*Notified pt of Surgery: Date: 05/12/21 ? ?PRE-OP UA & CX: Yes, obtained in office today 05/12/2021 ? ?*Placed into Prior Authorization Work Fabio Bering Date: 05/12/21 ? ? ?Assistant/laser/rep:No ? ? ? ? ? ? ? ? ? ? ? ? ? ? ? ?

## 2021-05-15 LAB — CULTURE, URINE COMPREHENSIVE

## 2021-05-18 ENCOUNTER — Other Ambulatory Visit
Admission: RE | Admit: 2021-05-18 | Discharge: 2021-05-18 | Disposition: A | Discharge status: Home / Self Care | Payer: Medicare Other | Source: Ambulatory Visit | Attending: Urology | Admitting: Urology

## 2021-05-18 NOTE — Patient Instructions (Addendum)
?Your procedure is scheduled on: Monday May 25, 2021. ?Report to Day Surgery inside [facility] 2nd floor, stop by admissions desk before getting on elevator. ?To find out your arrival time please call [phone] between 1PM - 3PM on Friday May 22, 2021. ? ?Remember: Instructions that are not followed completely may result in serious medical risk,  ?up to and including death, or upon the discretion of your surgeon and anesthesiologist your  ?surgery may need to be rescheduled.  ? ?  _X__ 1. Do not eat food or drink fluids after midnight the night before your procedure. ?                No chewing gum or hard candies.  ? ?__X__2.  On the morning of surgery brush your teeth with toothpaste and water, you ?               may rinse your mouth with mouthwash if you wish.  Do not swallow any toothpaste or mouthwash. ?   ? _X__ 3.  No Alcohol for 24 hours before or after surgery. ? ? _X__ 4.  Do Not Smoke or use e-cigarettes For 24 Hours Prior to Your Surgery. ?                Do not use any chewable tobacco products for at least 6 hours prior to ?                Surgery. ? ?_X__  5.  Do not use any recreational drugs (marijuana, cocaine, heroin, ecstasy, MDMA or other) ?               For at least one week prior to your surgery.  Combination of these drugs with anesthesia ?               May have life threatening results. ? ?____  6.  Bring all medications with you on the day of surgery if instructed.  ? ?__X_ 7.  Notify your doctor if there is any change in your medical condition  ?    (cold, fever, infections). ?    ?Do not wear jewelry, make-up, hairpins, clips or nail polish. ?Do not wear lotions, powders, or perfumes. You may wear deodorant. ?Do not shave 48 hours prior to surgery. Men may shave face and neck. ?Do not bring valuables to the hospital.   ? ?[facility] is not responsible for any belongings or valuables. ? ?Contacts, dentures or bridgework may not be worn into  surgery. ?Leave your suitcase in the car. After surgery it may be brought to your room. ?For patients admitted to the hospital, discharge time is determined by your ?treatment team. ?  ?Patients discharged the day of surgery will not be allowed to drive home.   ?Make arrangements for someone to be with you for the first 24 hours of your ?Same Day Discharge. ? ? ?__X__ Take these medicines the morning of surgery with A SIP OF WATER:  ? ? 1. tamsulosin (FLOMAX) 0.4 MG  ? 2.  ? 3.  ? 4. ? 5. ? 6. ? ?____ Fleet Enema (as directed)  ? ?____ Use CHG Soap (or wipes) as directed ? ?____ Use Benzoyl Peroxide Gel as instructed ? ?____ Use inhalers on the day of surgery ? ?____ Stop metformin 2 days prior to surgery   ? ?____ Take 1/2 of usual insulin dose the night before surgery. No insulin the morning ?  of surgery.  ? ?____ Call your PCP, cardiologist, or Pulmonologist if taking Coumadin/Plavix/aspirin and ask when to stop before your surgery.  ? ?__X__ One Week prior to surgery- Stop Anti-inflammatories such as Ibuprofen, Aleve, Advil, Motrin, meloxicam (MOBIC), diclofenac, etodolac, ketorolac, Toradol, Daypro, piroxicam, Goody's or BC powders. OK TO USE TYLENOL IF NEEDED ?  ?__X__ Stop supplements until after surgery.   ? ?____ Bring C-Pap to the hospital.  ? ? ?If you have any questions regarding your pre-procedure instructions,  ?Please call Pre-admit Testing at [phone] ?

## 2021-05-19 ENCOUNTER — Encounter
Admission: RE | Admit: 2021-05-19 | Discharge: 2021-05-19 | Disposition: A | Discharge status: Home / Self Care | Payer: Medicare Other | Source: Ambulatory Visit | Attending: Urology | Admitting: Urology

## 2021-05-19 DIAGNOSIS — Z01810 Encounter for preprocedural cardiovascular examination: Secondary | ICD-10-CM | POA: Diagnosis present

## 2021-05-25 ENCOUNTER — Ambulatory Visit
Admission: RE | Admit: 2021-05-25 | Discharge: 2021-05-25 | Disposition: A | Discharge status: Home / Self Care | Payer: Medicare Other | Source: Ambulatory Visit | Attending: Urology | Admitting: Urology

## 2021-05-25 ENCOUNTER — Ambulatory Visit: Payer: Medicare Other | Admitting: Anesthesiology

## 2021-05-25 ENCOUNTER — Encounter
Admission: RE | Disposition: A | Discharge status: Home / Self Care | Payer: Self-pay | Source: Ambulatory Visit | Attending: Urology

## 2021-05-25 ENCOUNTER — Encounter: Payer: Self-pay | Admitting: Urology

## 2021-05-25 ENCOUNTER — Other Ambulatory Visit: Payer: Self-pay

## 2021-05-25 DIAGNOSIS — N401 Enlarged prostate with lower urinary tract symptoms: Secondary | ICD-10-CM

## 2021-05-25 DIAGNOSIS — N138 Other obstructive and reflux uropathy: Secondary | ICD-10-CM | POA: Diagnosis not present

## 2021-05-25 DIAGNOSIS — R338 Other retention of urine: Secondary | ICD-10-CM | POA: Insufficient documentation

## 2021-05-25 DIAGNOSIS — C61 Malignant neoplasm of prostate: Secondary | ICD-10-CM | POA: Insufficient documentation

## 2021-05-25 DIAGNOSIS — N320 Bladder-neck obstruction: Secondary | ICD-10-CM | POA: Insufficient documentation

## 2021-05-25 HISTORY — PX: HOLEP-LASER ENUCLEATION OF THE PROSTATE WITH MORCELLATION: SHX6641

## 2021-05-25 SURGERY — ENUCLEATION, PROSTATE, USING LASER, WITH MORCELLATION
Anesthesia: General

## 2021-05-25 MED ORDER — FENTANYL CITRATE (PF) 100 MCG/2ML IJ SOLN
INTRAMUSCULAR | Status: AC
  Filled 2021-05-25: qty 2

## 2021-05-25 MED ORDER — OXYCODONE-ACETAMINOPHEN 5-325 MG PO TABS: 1.0000 | ORAL_TABLET | ORAL | 0 refills | Status: DC | PRN

## 2021-05-25 MED ORDER — LACTATED RINGERS IV SOLN
INTRAVENOUS | Status: DC | PRN
Start: 2021-05-25 — End: 2021-05-25

## 2021-05-25 MED ORDER — EPHEDRINE SULFATE (PRESSORS) 50 MG/ML IJ SOLN
INTRAMUSCULAR | Status: DC | PRN
  Administered 2021-05-25 (×2): 10 mg via INTRAVENOUS
  Administered 2021-05-25: 5 mg via INTRAVENOUS
  Administered 2021-05-25: 10 mg via INTRAVENOUS

## 2021-05-25 MED ORDER — ACETAMINOPHEN 10 MG/ML IV SOLN
INTRAVENOUS | Status: AC
  Administered 2021-05-25: 1000 mg via INTRAVENOUS
  Filled 2021-05-25: qty 100

## 2021-05-25 MED ORDER — CEFAZOLIN SODIUM-DEXTROSE 2-4 GM/100ML-% IV SOLN
INTRAVENOUS | Status: AC
  Filled 2021-05-25: qty 100

## 2021-05-25 MED ORDER — LACTATED RINGERS IV SOLN: INTRAVENOUS | Status: DC

## 2021-05-25 MED ORDER — ONDANSETRON HCL 4 MG/2ML IJ SOLN
INTRAMUSCULAR | Status: DC | PRN
  Administered 2021-05-25: 4 mg via INTRAVENOUS

## 2021-05-25 MED ORDER — PHENYLEPHRINE HCL (PRESSORS) 10 MG/ML IV SOLN
INTRAVENOUS | Status: DC | PRN
  Administered 2021-05-25: 240 ug via INTRAVENOUS

## 2021-05-25 MED ORDER — HYDROMORPHONE HCL 1 MG/ML IJ SOLN
INTRAMUSCULAR | Status: DC | PRN
  Administered 2021-05-25: 1 mg via INTRAVENOUS

## 2021-05-25 MED ORDER — FAMOTIDINE 20 MG PO TABS: 20.0000 mg | ORAL_TABLET | Freq: Once | ORAL | Status: AC

## 2021-05-25 MED ORDER — LIDOCAINE HCL (CARDIAC) PF 100 MG/5ML IV SOSY
PREFILLED_SYRINGE | INTRAVENOUS | Status: DC | PRN
Start: 2021-05-25 — End: 2021-05-25
  Administered 2021-05-25: 100 mg via INTRAVENOUS

## 2021-05-25 MED ORDER — FAMOTIDINE 20 MG PO TABS
ORAL_TABLET | ORAL | Status: AC
  Administered 2021-05-25: 20 mg via ORAL
  Filled 2021-05-25: qty 1

## 2021-05-25 MED ORDER — ONDANSETRON HCL 4 MG/2ML IJ SOLN
INTRAMUSCULAR | Status: AC
  Filled 2021-05-25: qty 2

## 2021-05-25 MED ORDER — HYDROMORPHONE HCL 1 MG/ML IJ SOLN
INTRAMUSCULAR | Status: AC
  Filled 2021-05-25: qty 1

## 2021-05-25 MED ORDER — CHLORHEXIDINE GLUCONATE 0.12 % MT SOLN
OROMUCOSAL | Status: AC
  Administered 2021-05-25: 15 mL via OROMUCOSAL
  Filled 2021-05-25: qty 15

## 2021-05-25 MED ORDER — PHENYLEPHRINE 40 MCG/ML (10ML) SYRINGE FOR IV PUSH (FOR BLOOD PRESSURE SUPPORT)
PREFILLED_SYRINGE | INTRAVENOUS | Status: DC | PRN
Start: 2021-05-25 — End: 2021-05-25
  Administered 2021-05-25: 240 ug via INTRAVENOUS

## 2021-05-25 MED ORDER — ORAL CARE MOUTH RINSE: 15.0000 mL | Freq: Once | OROMUCOSAL | Status: AC

## 2021-05-25 MED ORDER — PHENYLEPHRINE HCL-NACL 20-0.9 MG/250ML-% IV SOLN
INTRAVENOUS | Status: DC | PRN
  Administered 2021-05-25: 50 ug/min via INTRAVENOUS

## 2021-05-25 MED ORDER — OXYCODONE HCL 5 MG/5ML PO SOLN: 5.0000 mg | Freq: Once | ORAL | Status: DC | PRN

## 2021-05-25 MED ORDER — ACETAMINOPHEN 10 MG/ML IV SOLN: 1000.0000 mg | Freq: Once | INTRAVENOUS | Status: DC | PRN

## 2021-05-25 MED ORDER — ONDANSETRON HCL 4 MG/2ML IJ SOLN
4.0000 mg | Freq: Once | INTRAMUSCULAR | Status: AC | PRN
  Administered 2021-05-25: 4 mg via INTRAVENOUS

## 2021-05-25 MED ORDER — OXYCODONE HCL 5 MG PO TABS: 5.0000 mg | ORAL_TABLET | Freq: Once | ORAL | Status: DC | PRN

## 2021-05-25 MED ORDER — SODIUM CHLORIDE 0.9 % IR SOLN
Status: DC | PRN
  Administered 2021-05-25: 45000 mL via INTRAVESICAL

## 2021-05-25 MED ORDER — DEXAMETHASONE SODIUM PHOSPHATE 10 MG/ML IJ SOLN
INTRAMUSCULAR | Status: DC | PRN
Start: 2021-05-25 — End: 2021-05-25
  Administered 2021-05-25: 10 mg via INTRAVENOUS

## 2021-05-25 MED ORDER — EPHEDRINE 5 MG/ML INJ
INTRAVENOUS | Status: AC
  Filled 2021-05-25: qty 5

## 2021-05-25 MED ORDER — PROPOFOL 10 MG/ML IV BOLUS
INTRAVENOUS | Status: DC | PRN
  Administered 2021-05-25: 200 mg via INTRAVENOUS

## 2021-05-25 MED ORDER — PROPOFOL 500 MG/50ML IV EMUL
INTRAVENOUS | Status: AC
  Filled 2021-05-25: qty 50

## 2021-05-25 MED ORDER — CHLORHEXIDINE GLUCONATE 0.12 % MT SOLN: 15.0000 mL | Freq: Once | OROMUCOSAL | Status: AC

## 2021-05-25 MED ORDER — FENTANYL CITRATE (PF) 100 MCG/2ML IJ SOLN: 25.0000 ug | INTRAMUSCULAR | Status: DC | PRN

## 2021-05-25 MED ORDER — CEFAZOLIN SODIUM-DEXTROSE 2-4 GM/100ML-% IV SOLN
2.0000 g | INTRAVENOUS | Status: AC
  Administered 2021-05-25: 2 g via INTRAVENOUS

## 2021-05-25 MED ORDER — FENTANYL CITRATE (PF) 100 MCG/2ML IJ SOLN
INTRAMUSCULAR | Status: DC | PRN
Start: 2021-05-25 — End: 2021-05-25
  Administered 2021-05-25 (×2): 50 ug via INTRAVENOUS

## 2021-05-25 MED ORDER — OXYBUTYNIN CHLORIDE 5 MG PO TABS: 5.0000 mg | ORAL_TABLET | Freq: Three times a day (TID) | ORAL | 0 refills | Status: DC | PRN

## 2021-05-25 SURGICAL SUPPLY — 38 items
ADAPTER IRRIG TUBE 2 SPIKE SOL (ADAPTER) ×3 IMPLANT
ADPR TBG 2 SPK PMP STRL ASCP (ADAPTER) ×1
BAG DRN LRG CPC RND TRDRP CNTR (MISCELLANEOUS) ×1
BAG DRN RND TRDRP ANRFLXCHMBR (UROLOGICAL SUPPLIES)
BAG URINE DRAIN 2000ML AR STRL (UROLOGICAL SUPPLIES) IMPLANT
BAG URO DRAIN 4000ML (MISCELLANEOUS) ×1 IMPLANT
CATH FOL 2WAY LX 20X30 (CATHETERS) IMPLANT
CATH FOL 2WAY LX 22X30 (CATHETERS) IMPLANT
CATH FOLEY 3WAY 30CC 22FR (CATHETERS) IMPLANT
CATH URETL OPEN 5X70 (CATHETERS) ×2 IMPLANT
CONTAINER COLLECT MORCELLATR (MISCELLANEOUS) ×1 IMPLANT
DRAPE 3/4 80X56 (DRAPES) ×2 IMPLANT
DRAPE UTILITY 15X26 TOWEL STRL (DRAPES) ×1 IMPLANT
FIBER LASER FLEXIVA PULSE 550 (Laser) ×1 IMPLANT
FIBER LASER MOSES 550 DFL (Laser) ×1 IMPLANT
FILTER OVERFLOW MORCELLATOR (FILTER) ×1 IMPLANT
GAUZE 4X4 16PLY ~~LOC~~+RFID DBL (SPONGE) ×3 IMPLANT
GLOVE BIO SURGEON STRL SZ 6.5 (GLOVE) ×4 IMPLANT
GOWN STRL REUS W/ TWL LRG LVL3 (GOWN DISPOSABLE) ×2 IMPLANT
GOWN STRL REUS W/TWL LRG LVL3 (GOWN DISPOSABLE) ×4
HOLDER FOLEY CATH W/STRAP (MISCELLANEOUS) ×2 IMPLANT
IV NS IRRIG 3000ML ARTHROMATIC (IV SOLUTION) ×8 IMPLANT
KIT TURNOVER CYSTO (KITS) ×2 IMPLANT
MBRN O SEALING YLW 17 FOR INST (MISCELLANEOUS) ×2
MEMBRANE SLNG YLW 17 FOR INST (MISCELLANEOUS) ×1 IMPLANT
MORCELLATOR COLLECT CONTAINER (MISCELLANEOUS) ×2
MORCELLATOR OVERFLOW FILTER (FILTER) ×2
MORCELLATOR ROTATION 4.75 335 (MISCELLANEOUS) ×2 IMPLANT
PACK CYSTO AR (MISCELLANEOUS) ×2 IMPLANT
SET CYSTO W/LG BORE CLAMP LF (SET/KITS/TRAYS/PACK) ×1 IMPLANT
SET IRRIG Y TYPE TUR BLADDER L (SET/KITS/TRAYS/PACK) ×2 IMPLANT
SLEEVE PROTECTION STRL DISP (MISCELLANEOUS) ×4 IMPLANT
SURGILUBE 2OZ TUBE FLIPTOP (MISCELLANEOUS) ×2 IMPLANT
SYR TOOMEY IRRIG 70ML (MISCELLANEOUS) ×2
SYRINGE TOOMEY IRRIG 70ML (MISCELLANEOUS) ×1 IMPLANT
TUBE PUMP MORCELLATOR PIRANHA (TUBING) ×2 IMPLANT
WATER STERILE IRR 1000ML POUR (IV SOLUTION) ×2 IMPLANT
WATER STERILE IRR 500ML POUR (IV SOLUTION) ×2 IMPLANT

## 2021-05-25 NOTE — Interval H&P Note (Signed)
History and Physical Interval Note: ? ?05/25/2021 ?10:21 AM ? ?Perry Bell  has presented today for surgery, with the diagnosis of Benign Prostatic Hyperplasia with urinary obstruction.  The various methods of treatment have been discussed with the patient and family. After consideration of risks, benefits and other options for treatment, the patient has consented to  Procedure(s): ?HOLEP-LASER ENUCLEATION OF THE PROSTATE WITH MORCELLATION (N/A) as a surgical intervention.  The patient's history has been reviewed, patient examined, no change in status, stable for surgery.  I have reviewed the patient's chart and labs.  Questions were answered to the patient's satisfaction.   ? ?RRR ?CTAB ? ? ?Hollice Espy ? ? ?

## 2021-05-25 NOTE — Discharge Instructions (Addendum)
We discussed alternatives including TURP vs. holmium laser enucleation of the prostate vs. greenlight laser ablation. Differences between the surgical procedures were discussed as well as the risks and benefits of each.  He is most interested in HoLEP. ? ?We discussed the common postoperative course following holep including need for overnight Foley catheter, temporary worsening of irritative voiding symptoms, and occasional stress incontinence which typically lasts up to 6 months but can persist.  We discussed retrograde ejaculation and damage to surrounding structures including the urinary sphincter. Other uncommon complications including hematuria and urinary tract infection. ? ?He understands all of the above and is willing to proceed as planned. ?AMBULATORY SURGERY  ?DISCHARGE INSTRUCTIONS ? ? ?The drugs that you were given will stay in your system until tomorrow so for the next 24 hours you should not: ? ?Drive an automobile ?Make any legal decisions ?Drink any alcoholic beverage ? ? ?You may resume regular meals tomorrow.  Today it is better to start with liquids and gradually work up to solid foods. ? ?You may eat anything you prefer, but it is better to start with liquids, then soup and crackers, and gradually work up to solid foods. ? ? ?Please notify your doctor immediately if you have any unusual bleeding, trouble breathing, redness and pain at the surgery site, drainage, fever, or pain not relieved by medication. ? ? ? ?Additional Instructions: ? ? ? ? ? ? ? ?Please contact your physician with any problems or Same Day Surgery at [phone], Monday through Friday 6 am to 4 pm, or [place] [facility] number at [phone].  ?

## 2021-05-25 NOTE — Anesthesia Preprocedure Evaluation (Addendum)
Anesthesia Evaluation  ?Patient identified by MRN, date of birth, ID band ?Patient awake ? ? ? ?Reviewed: ?Allergy & Precautions, NPO status , Patient's Chart, lab work & pertinent test results ? ?History of Anesthesia Complications ?Negative for: history of anesthetic complications ? ?Airway ?Mallampati: III ? ? ?Neck ROM: Full ? ? ? Dental ? ?(+) Missing ?  ?Pulmonary ?neg pulmonary ROS,  ?  ?Pulmonary exam normal ?breath sounds clear to auscultation ? ? ? ? ? ? Cardiovascular ?Normal cardiovascular exam ?Rhythm:Regular Rate:Normal ? ?ECG 05/19/21: normal ?  ?Neuro/Psych ?negative neurological ROS ?   ? GI/Hepatic ?negative GI ROS,   ?Endo/Other  ?negative endocrine ROS ? Renal/GU ?negative Renal ROS  ? ?BPH ? ?  ?Musculoskeletal ? ? Abdominal ?  ?Peds ? Hematology ?negative hematology ROS ?(+)   ?Anesthesia Other Findings ? ? Reproductive/Obstetrics ? ?  ? ? ? ? ? ? ? ? ? ? ? ? ? ?  ?  ? ? ? ? ? ? ? ?Anesthesia Physical ?Anesthesia Plan ? ?ASA: 2 ? ?Anesthesia Plan: General  ? ?Post-op Pain Management:   ? ?Induction: Intravenous ? ?PONV Risk Score and Plan: 2 and Ondansetron, Dexamethasone and Treatment may vary due to age or medical condition ? ?Airway Management Planned: LMA ? ?Additional Equipment:  ? ?Intra-op Plan:  ? ?Post-operative Plan: Extubation in OR ? ?Informed Consent: I have reviewed the patients History and Physical, chart, labs and discussed the procedure including the risks, benefits and alternatives for the proposed anesthesia with the patient or authorized representative who has indicated his/her understanding and acceptance.  ? ? ? ?Dental advisory given ? ?Plan Discussed with: CRNA ? ?Anesthesia Plan Comments: (Patient consented for risks of anesthesia including but not limited to:  ?- adverse reactions to medications ?- damage to eyes, teeth, lips or other oral mucosa ?- nerve damage due to positioning  ?- sore throat or hoarseness ?- damage to heart, brain,  nerves, lungs, other parts of body or loss of life ? ?Informed patient about role of CRNA in peri- and intra-operative care.  Patient voiced understanding.)  ? ? ? ? ? ? ?Anesthesia Quick Evaluation ? ?

## 2021-05-25 NOTE — Anesthesia Postprocedure Evaluation (Signed)
Anesthesia Post Note ? ?Patient: Perry Bell ? ?Procedure(s) Performed: HOLEP-LASER ENUCLEATION OF THE PROSTATE WITH MORCELLATION ? ?Patient location during evaluation: PACU ?Anesthesia Type: General ?Level of consciousness: awake and alert, oriented and patient cooperative ?Pain management: pain level controlled ?Vital Signs Assessment: post-procedure vital signs reviewed and stable ?Respiratory status: spontaneous breathing, nonlabored ventilation and respiratory function stable ?Cardiovascular status: blood pressure returned to baseline and stable ?Postop Assessment: adequate PO intake ?Anesthetic complications: no ? ? ?No notable events documented. ? ? ?Last Vitals:  ?Vitals:  ? 05/25/21 1410 05/25/21 1415  ?BP:  (!) 150/90  ?Pulse: 91 88  ?Resp: 13 10  ?Temp:  (!) 36.1 ?C  ?SpO2: 96% 92%  ?  ?Last Pain:  ?Vitals:  ? 05/25/21 1415  ?TempSrc:   ?PainSc: 0-No pain  ? ? ?  ?  ?  ?  ?  ?  ? ?Darrin Nipper ? ? ? ? ?

## 2021-05-25 NOTE — Op Note (Signed)
Date of procedure: 05/25/21 ? ?Preoperative diagnosis:  ?BPH with BOO ? ?Postoperative diagnosis:  ?same  ? ?Procedure: ?HoLEP with morcellation ? ?Surgeon: Hollice Espy, MD ? ?Anesthesia: General ? ?Complications: None ? ?Intraoperative findings: Significant trilobar coaptation of the prostate which was irregular with nodularity.  Trabeculated bladder. ? ?EBL: 200 cc ? ?Specimens: Prostate chips ? ?Drains: 74 French two-way Foley catheter overfilled with 50 cc in the balloon ? ?Indication: Perry Bell is a 71 y.o. patient with personal history of BPH with incomplete bladder emptying with worsening urinary symptoms.  After reviewing the management options for treatment, he elected to proceed with the above surgical procedure(s). We have discussed the potential benefits and risks of the procedure, side effects of the proposed treatment, the likelihood of the patient achieving the goals of the procedure, and any potential problems that might occur during the procedure or recuperation. Informed consent has been obtained. ? ?Description of procedure: ? ?The patient was taken to the operating room and general anesthesia was induced.  The patient was placed in the dorsal lithotomy position, prepped and draped in the usual sterile fashion, and preoperative antibiotics were administered. A preoperative time-out was performed.  ?  ? A 26 French resectoscope sheath using a blunt angled obturator was introduced without difficulty into the bladder.  The bladder was carefully inspected and noted to be moderately trabeculated.  There is an elevated bladder neck with a significant intravesical component of all 3 lobes the trigone was able to be visualized with some manipulation and the UOs were a good distance bladder neck itself.  The prostatic fossa had significant trilobar coaptation with greater than 5 cm prostatic length.  A 550 ?m laser fiber was then brought in and using settings of 2 J's and 60 Hz, 2 incisions were  created at the 5:00 and 7:00 positions of the bladder neck on either side of the median lobe down to the level of the bladder neck/capsular fibers.  The incision was carried down caudally meeting in the midline just above the verumontanum.  The median lobe was then enucleated from a caudal to cranial direction cleaving the adenoma off the underlying capsule rolling it towards the bladder neck and ultimately cleaving the mucosa to free the median lobe into the bladder. ?  ?Next, a semilunar incision was created at the prostatic apex on the right side again freeing up the adenoma from the underlying capsule.  Care was taken to avoid any resection past the verumontanum.  This incision was carried around laterally and cranially towards the bladder neck.  Ultimately, I was able to complete the anterior commissure mucosa and the adenoma into the bladder creating a widely patent prostatic fossa.   ?  ?Next, the same similar incision was created at the left prostatic apex.  This adenoma however ended up being enucleated and more of a piece wise fashion freeing up a large BPH nodules from the capsular fibers.  The side was fairly irregular presumably related to his previous greenlight laser.  Once this was completed and cleared from the bladder neck, the prostatic fossa was noted to be widely patent.  Hemostasis was achieved using hemostatic fiber settings.  Bilateral UOs were visualized and free of any injury.  Finally, the 45 French resectoscope was exchanged for nephroscope and using the Piranha handpiece morcellator, the bladder was distended in each of the prostate chips were evacuated.  The bladder was irrigated several times and smaller chips were clear for the bladder.  This point  time, there were no residual fibers appreciated in the bladder.  Hemostasis was adequate.  10 mg of IV Lasix was administered to help with postoperative diuresis.  A 20 French two-way Foley catheter was then inserted over a catheter guide  with 50 cc in the balloon.  The catheter irrigated easily and well.  Patient was then clean and dry, repositioned supine position, reversed from anesthesia, taken to PACU in stable condition. ?  ?Plan: Patient will return to the office in 3 days for voiding trial.    He will follow-up with me in 6 weeks for IPSS/PVR.  He will also need a PSA again in 6 months. ?  ? ?Hollice Espy, M.D. ? ?

## 2021-05-25 NOTE — Transfer of Care (Signed)
Immediate Anesthesia Transfer of Care Note ? ?Patient: Perry Bell ? ?Procedure(s) Performed: HOLEP-LASER ENUCLEATION OF THE PROSTATE WITH MORCELLATION ? ?Patient Location: PACU ? ?Anesthesia Type:General ? ?Level of Consciousness: drowsy ? ?Airway & Oxygen Therapy: Patient Spontanous Breathing and Patient connected to face mask oxygen ? ?Post-op Assessment: Report given to RN and Post -op Vital signs reviewed and stable ? ?Post vital signs: Reviewed and stable ? ?Last Vitals:  ?Vitals Value Taken Time  ?BP    ?Temp    ?Pulse    ?Resp    ?SpO2    ? ? ?Last Pain:  ?Vitals:  ? 05/25/21 0804  ?TempSrc: Oral  ?PainSc: 0-No pain  ?   ? ?  ? ?Complications: No notable events documented. ?

## 2021-05-25 NOTE — Anesthesia Procedure Notes (Signed)
Procedure Name: LMA Insertion ?Date/Time: 05/25/2021 10:52 AM ?Performed by: Beverely Low, CRNA ?Pre-anesthesia Checklist: Patient identified, Patient being monitored, Timeout performed, Emergency Drugs available and Suction available ?Patient Re-evaluated:Patient Re-evaluated prior to induction ?Oxygen Delivery Method: Circle system utilized ?Preoxygenation: Pre-oxygenation with 100% oxygen ?Induction Type: IV induction ?Ventilation: Mask ventilation without difficulty ?LMA: LMA inserted ?LMA Size: 4.5 ?Tube type: Oral ?Number of attempts: 1 ?Placement Confirmation: positive ETCO2 and breath sounds checked- equal and bilateral ?Tube secured with: Tape ?Dental Injury: Teeth and Oropharynx as per pre-operative assessment  ? ? ? ? ?

## 2021-05-26 ENCOUNTER — Encounter: Payer: Self-pay | Admitting: Urology

## 2021-05-27 LAB — SURGICAL PATHOLOGY

## 2021-05-28 ENCOUNTER — Ambulatory Visit: Payer: Medicare Other | Admitting: Physician Assistant

## 2021-05-28 VITALS — BP 177/99 | HR 101

## 2021-05-28 DIAGNOSIS — N138 Other obstructive and reflux uropathy: Secondary | ICD-10-CM | POA: Diagnosis not present

## 2021-05-28 DIAGNOSIS — N401 Enlarged prostate with lower urinary tract symptoms: Secondary | ICD-10-CM | POA: Diagnosis not present

## 2021-05-28 LAB — BLADDER SCAN AMB NON-IMAGING: Scan Result: 244

## 2021-05-28 NOTE — Progress Notes (Signed)
Afternoon follow-up ? ?Patient returned to clinic this afternoon for repeat PVR. He has been able to urinate. He has had mild urinary leakage. PVR 257m; patient voided an additional 250 mL after bladder scan.  He reports being very pleased with the outcome of his surgery. ? ?Results for orders placed or performed in visit on 05/28/21  ?BLADDER SCAN AMB NON-IMAGING  ?Result Value Ref Range  ? Scan Result 244 ml   ? ?Voiding trial passed. Counseled patient on normal postoperative findings including dysuria, gross hematuria, and urinary urgency/leakage.  We discussed his surgical pathology results.  I encouraged him to start Kegel exercises 3x10 sets daily to increase urinary control. ? ?Follow up: 6 week postop f/u with Dr. BErlene Quan ?

## 2021-05-28 NOTE — Progress Notes (Signed)
Catheter Removal ? ?Patient is present today for a catheter removal.  2m of water was drained from the balloon. A 20FR foley cath was removed from the bladder no complications were noted . Patient tolerated well. ? ?Performed by: JGaspar ColaCMA ? ?Follow up/ Additional notes: As scheduled   ?

## 2021-05-28 NOTE — Patient Instructions (Signed)

## 2021-07-09 ENCOUNTER — Ambulatory Visit: Payer: Medicare Other | Admitting: Urology

## 2021-07-09 ENCOUNTER — Encounter: Payer: Self-pay | Admitting: Urology

## 2021-07-09 VITALS — BP 141/87 | HR 75 | Ht 70.0 in | Wt 210.0 lb

## 2021-07-09 DIAGNOSIS — R339 Retention of urine, unspecified: Secondary | ICD-10-CM | POA: Diagnosis not present

## 2021-07-09 DIAGNOSIS — N401 Enlarged prostate with lower urinary tract symptoms: Secondary | ICD-10-CM | POA: Diagnosis not present

## 2021-07-09 DIAGNOSIS — N138 Other obstructive and reflux uropathy: Secondary | ICD-10-CM | POA: Diagnosis not present

## 2021-07-09 DIAGNOSIS — Z8546 Personal history of malignant neoplasm of prostate: Secondary | ICD-10-CM

## 2021-07-09 LAB — BLADDER SCAN AMB NON-IMAGING: Scan Result: 31

## 2021-07-09 NOTE — Progress Notes (Signed)
07/09/2021 8:27 AM   Perry Bell 05/01/1950 [id number]  Referring provider:  Leonel Ramsay, MD [place],  [place] [address] Chief Complaint  Patient presents with   Benign Prostatic Hypertrophy     HPI: Perry Bell is a 71 y.o.male with a personal history of prostate cancer and BPH with outlet obstructionwho presents today for a 6 week post-op follow-up with IPSS and PVR.   He is s/p greenlight laser in 2019. His PSA at the time had been steadily rising with a prior PSA of 20.1 in 05/2017. He underwent a prostate biopsy that was negative.   His PSA continued to fluctuate/elevate with a PSA of 19.82 in 12/2019 that elevated to a PSA of 22.4 on 10/16/2020. He underwent a prostate biopsy on 11/05/2020 that was consistent with Gleason 3+3 involving 1/1 cores affecting 20%. TRUS 155 gm.   He is undergoing active surveillance for his prostate cancer. His most recent surveillance imaging on 05/04/2021 with prostate MRI visualized no high-grade carcinoma within the thin peripheral zone. PI-RADS:2. It also visualized an enlarged nodular transitional zone most consistent with BPH, and prostatomegaly.   His most recent PSA was 22.6 on 04/30/2021.  He is s/p HoLEP for his BPH with BOO on 05/25/2021. Intraoperative findings showed significant trilobar coaptation of the prostate which was irregular with nodularity.  Trabeculated bladder. 76.5 cc resected.   Surgical pathology was consistent with Gleason 3+3 involving 10-15% of examine tissue, acinar adenocarcinoma of the prostate, prostatic grandular and stromal hyperplasia, inflamed urothelial mucosa, and focal necroinflammatory debris and calcification.  IPSS 7 today and PVR of 38m.   He reports today that his leakage has improved. He has leakage intermittently. He is overall very pleased with his symptoms after HoLEP.    IPSS     Row Name 07/09/21 0800         International Prostate Symptom Score   How often have  you had the sensation of not emptying your bladder? Less than 1 in 5     How often have you had to urinate less than every two hours? About half the time     How often have you found you stopped and started again several times when you urinated? Not at All     How often have you found it difficult to postpone urination? About half the time     How often have you had a weak urinary stream? Not at All     How often have you had to strain to start urination? Not at All     How many times did you typically get up at night to urinate? None     Total IPSS Score 7              Score:  1-7 Mild 8-19 Moderate 20-35 Severe   PMH: Past Medical History:  Diagnosis Date   Elevated PSA    Enlarged prostate     Surgical History: Past Surgical History:  Procedure Laterality Date   CYST EXCISION  2005   from Left Testicle    GREEN LIGHT LASER TURP (TRANSURETHRAL RESECTION OF PROSTATE  2018   HOLEP-LASER ENUCLEATION OF THE PROSTATE WITH MORCELLATION N/A 05/25/2021   Procedure: HOLEP-LASER ENUCLEATION OF THE PROSTATE WITH MORCELLATION;  Surgeon: BHollice Espy MD;  Location: [facility];  Service: Urology;  Laterality: N/A;   INGUINAL HERNIA REPAIR Right 10/26/2019   Procedure: RIGHT INGUINAL HERNIA REPAIR WITH MESH;  Surgeon: BRobert Bellow MD;  Location:  [facility];  Service: General;  Laterality: Right;   UMBILICAL HERNIA REPAIR N/A 10/26/2019   Procedure: HERNIA REPAIR UMBILICAL ADULT;  Surgeon: Robert Bellow, MD;  Location: [facility];  Service: General;  Laterality: N/A;    Home Medications:  Allergies as of 07/09/2021       Reactions   Penicillins Rash        Medication List        Accurate as of July 09, 2021  8:27 AM. If you have any questions, ask your nurse or doctor.          STOP taking these medications    oxybutynin 5 MG tablet Commonly known as: DITROPAN Stopped by: Hollice Espy, MD   oxyCODONE-acetaminophen 5-325 MG tablet Commonly known as:  Percocet Stopped by: Hollice Espy, MD   tamsulosin 0.4 MG Caps capsule Commonly known as: FLOMAX Stopped by: Hollice Espy, MD       TAKE these medications    multivitamin with minerals Tabs tablet Take 1 tablet by mouth daily.        Allergies:  Allergies  Allergen Reactions   Penicillins Rash    Family History: Family History  Problem Relation Age of Onset   Prostate cancer Maternal Grandfather     Social History:  reports that he has never smoked. He has never used smokeless tobacco. He reports that he does not currently use alcohol. He reports that he does not use drugs.   Physical Exam: BP (!) 141/87   Pulse 75   Ht '5\' 10"'$  (1.778 m)   Wt 210 lb (95.3 kg)   BMI 30.13 kg/m   Constitutional:  Alert and oriented, No acute distress. HEENT: [place] AT, moist mucus membranes.  Trachea midline, no masses. Cardiovascular: No clubbing, cyanosis, or edema. Respiratory: Normal respiratory effort, no increased work of breathing. Skin: No rashes, bruises or suspicious lesions. Neurologic: Grossly intact, no focal deficits, moving all 4 extremities. Psychiatric: Normal mood and affect.  Laboratory Data:  Lab Results  Component Value Date   CREATININE 1.20 10/24/2019   No results found for: HGBA1C  Pertinent Imaging: Results for orders placed or performed in visit on 07/09/21  BLADDER SCAN AMB NON-IMAGING  Result Value Ref Range   Scan Result 31 ml     Assessment & Plan:   BPH with chronic retention  - S/p HoLEP - He is very pleased with symptomatic improvement  - He is emptying adequately today with PVR of 31 mL he had previous PVRs in the 400 range.  - Reviewed common post-operative course   2. Prostate cancer - Gleason 3+3 prostate cancer; recommend continued active surveillance - Will follow up for PSA in 4.5 months for new baseline PSA - Anticipate that his PSA will lower based on amount of prostate that was resected and in light of him being able to  empty his bladder fully   F/u 4.5 mo PSA only, MD for PSA/IPSS/PVR/DRE in a year  I,Kailey Littlejohn,acting as a scribe for Hollice Espy, MD.,have documented all relevant documentation on the behalf of Hollice Espy, MD,as directed by  Hollice Espy, MD while in the presence of Hollice Espy, MD.  I have reviewed the above documentation for accuracy and completeness, and I agree with the above.   Hollice Espy, MD   [facility] [address] [place], [place] [address] [phone]

## 2021-12-01 ENCOUNTER — Other Ambulatory Visit: Payer: Medicare Other

## 2021-12-01 DIAGNOSIS — N138 Other obstructive and reflux uropathy: Secondary | ICD-10-CM

## 2021-12-02 LAB — PSA: Prostate Specific Ag, Serum: 2.9 ng/mL (ref 0.0–4.0)

## 2022-07-06 ENCOUNTER — Other Ambulatory Visit: Payer: Medicare Other

## 2022-07-12 ENCOUNTER — Other Ambulatory Visit: Payer: Medicare Other

## 2022-07-12 ENCOUNTER — Other Ambulatory Visit: Payer: Self-pay | Admitting: *Deleted

## 2022-07-12 DIAGNOSIS — R972 Elevated prostate specific antigen [PSA]: Secondary | ICD-10-CM

## 2022-07-13 LAB — PSA: Prostate Specific Ag, Serum: 3.1 ng/mL (ref 0.0–4.0)

## 2022-07-14 ENCOUNTER — Ambulatory Visit: Payer: Medicare Other | Admitting: Urology

## 2022-07-14 VITALS — BP 163/114 | HR 70 | Ht 70.0 in | Wt 210.0 lb

## 2022-07-14 DIAGNOSIS — N401 Enlarged prostate with lower urinary tract symptoms: Secondary | ICD-10-CM

## 2022-07-14 DIAGNOSIS — N138 Other obstructive and reflux uropathy: Secondary | ICD-10-CM

## 2022-07-14 DIAGNOSIS — C61 Malignant neoplasm of prostate: Secondary | ICD-10-CM | POA: Diagnosis not present

## 2022-07-14 LAB — BLADDER SCAN AMB NON-IMAGING: Scan Result: 0

## 2022-07-14 NOTE — Progress Notes (Signed)
Perry Bell presents for an office visit. BP today is _154/96__________. He is not on BP medication. Greater than 140/90. Provider  notified. Pt advised to_f/u with PCP_____________. Pt voiced understanding.

## 2022-07-14 NOTE — Progress Notes (Signed)
Marcelle Overlie Plume,acting as a scribe for Vanna Scotland, MD.,have documented all relevant documentation on the behalf of Vanna Scotland, MD,as directed by  Vanna Scotland, MD while in the presence of Vanna Scotland, MD.  07/14/2022 2:58 PM   Perry Bell 08/21/1950 [id number]  Referring provider: Mick Sell, MD [address] [place],  [place] [address]  Chief Complaint  Patient presents with   Follow-up   Benign Prostatic Hypertrophy    HPI: 72 year-old male with a personal history of massive BPH and incidental prostate cancer returns today for routing annual follow.   He is status post green light laser in 2019. His PSA has been steadily rising up to 22. He had a prostate biopsy in 2022 that showed Gleason 3+3 low volume disease. TRUS was 155. He elected active surveillance for his prostate cancer. He had a prostate MRI in 2023 that showed PI-RADS 2 enlarged nodular transition zone. Ultimately, he underwent HoLEP for his bladder outlet obstruction in 05/2021. Surgical pathology did show Gleason 3+3 disease in 10 to 15% of the examined tissue.   His post procedure PSA was 2.9. His PSA today is 3.1.  Today, he reports occasional urinary leakage when yawning but not when sneezing. 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 He continues to perform Kegel exercises as advised.  Results for orders placed or performed in visit on 07/14/22  Bladder Scan (Post Void Residual) in office  Result Value Ref Range   Scan Result 0 ml     IPSS     Row Name 07/14/22 0800         International Prostate Symptom Score   How often have you had the sensation of not emptying your bladder? Not at All     How often have you had to urinate less than every two hours? Not at All     How often have you found you stopped and started again several times when you urinated? Not at All     How often have you found it difficult to  postpone urination? Not at All     How often have you had a weak urinary stream? Not at All     How often have you had to strain to start urination? Not at All     How many times did you typically get up at night to urinate? 1 Time     Total IPSS Score 1       Quality of Life due to urinary symptoms   If you were to spend the rest of your life with your urinary condition just the way it is now how would you feel about that? Delighted              Score:  1-7 Mild 8-19 Moderate 20-35 Severe    PMH: Past Medical History:  Diagnosis Date   Elevated PSA    Enlarged prostate     Surgical History: Past Surgical History:  Procedure Laterality Date   CYST EXCISION  2005   from Left Testicle    GREEN LIGHT LASER TURP (TRANSURETHRAL RESECTION OF PROSTATE  2018   HOLEP-LASER ENUCLEATION OF THE PROSTATE WITH MORCELLATION N/A 05/25/2021   Procedure: HOLEP-LASER ENUCLEATION OF THE PROSTATE WITH MORCELLATION;  Surgeon: Vanna Scotland, MD;  Location: [facility];  Service: Urology;  Laterality: N/A;   INGUINAL HERNIA REPAIR Right 10/26/2019   Procedure: RIGHT INGUINAL HERNIA REPAIR WITH  MESH;  Surgeon: Earline Mayotte, MD;  Location: [facility];  Service: General;  Laterality: Right;   UMBILICAL HERNIA REPAIR N/A 10/26/2019   Procedure: HERNIA REPAIR UMBILICAL ADULT;  Surgeon: Earline Mayotte, MD;  Location: [facility];  Service: General;  Laterality: N/A;    Home Medications:  Allergies as of 07/14/2022       Reactions   Penicillins Rash        Medication List        Accurate as of July 14, 2022  2:58 PM. If you have any questions, ask your nurse or doctor.          multivitamin with minerals Tabs tablet Take 1 tablet by mouth daily.        Allergies:  Allergies  Allergen Reactions   Penicillins Rash    Family History: Family History  Problem Relation Age of Onset   Prostate cancer Maternal Grandfather     Social History:  reports that he has never smoked.  He has never used smokeless tobacco. He reports that he does not currently use alcohol. He reports that he does not use drugs.   Physical Exam: BP (!) 163/114   Pulse 70   Ht 5\' 10"  (1.778 m)   Wt 210 lb (95.3 kg)   BMI 30.13 kg/m   Constitutional:  Alert and oriented, No acute distress. HEENT: [place] AT, moist mucus membranes.  Trachea midline, no masses. GU: Significantly smaller prostate, consistent with post-surgical changes. Neurologic: Grossly intact, no focal deficits, moving all 4 extremities. Psychiatric: Normal mood and affect.  Assessment & Plan:    1. Prostate cancer - Continue active surveillance. - Monitor PSA levels annually. - Consider further imaging (MRI) if PSA velocity increases significantly.  2. BPH - Post-HoLEP status, significant reduction in prostate size. - Continue Kegel exercises to maintain pelvic floor strength and control urinary leakage.  Return in about 1 year (around 07/14/2023) for repeat PSA.  I have reviewed the above documentation for accuracy and completeness, and I agree with the above.   Vanna Scotland, MD   [facility] [address] [place], [place] [address] [phone]

## 2023-03-22 IMAGING — MR MR PROSTATE WO/W CM
56 series · 56 of 56 positions shown · IV contrast (gadavist)
Comparison: None.

CLINICAL DATA: 70 year old with Gleason 3+3=6 adenocarcinoma the
LEFT lateral base. PSA equal 22

EXAM:
MR PROSTATE WITHOUT AND WITH CONTRAST
TECHNIQUE: Multiplanar multisequence MRI images were obtained of the pelvis
centered about the prostate. Pre and post contrast images were
obtained.
CONTRAST:  8mL GADAVIST GADOBUTROL 1 MMOL/ML IV SOLN

[Series 3: ax in&out whole · axial · 6.0mm · 0.74mm/px · 1 of 35 slices shown (1 of 2)]
[im 1/35]
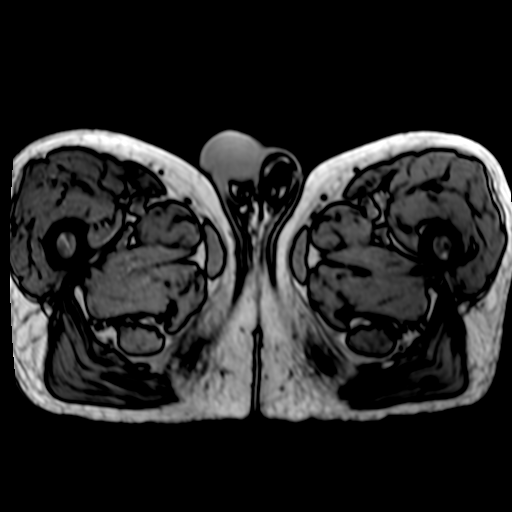

[Series 3: ax in&out whole · axial · 6.0mm · 0.74mm/px · 1 of 35 slices shown (2 of 2)]
[im 1/35]
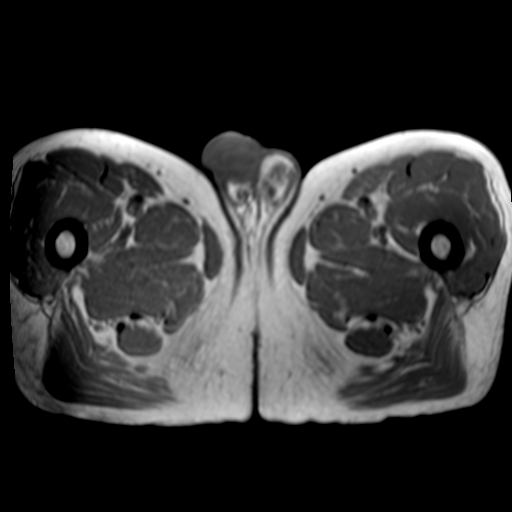

[Series 4: T2 · coronal · 3.0mm · 0.70mm/px · 1 of 35 slices shown (1 of 3)]
[im 1/35]
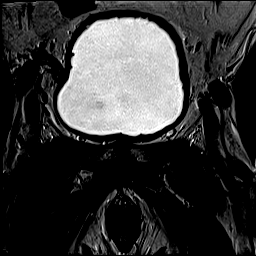

[Series 5: T2 · axial · 3.0mm · 0.56mm/px · 1 of 34 slices shown (2 of 3)]
[im 1/34]
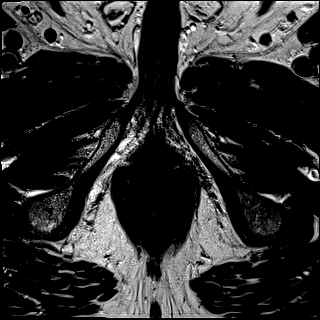

[Series 6: DWI · axial · 3.0mm · 0.86mm/px · 1 of 101 slices shown (1 of 3)]
[im 1/101]
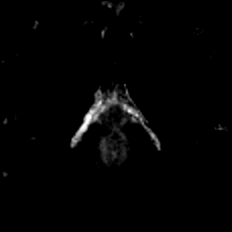

[Series 7: DWI · axial · 3.0mm · 0.86mm/px · 1 of 34 slices shown (2 of 3)]
[im 1/34]
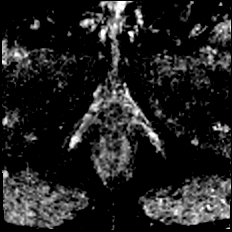

[Series 8: DWI · axial · 3.0mm · 0.86mm/px · 1 of 34 slices shown (3 of 3)]
[im 1/34]
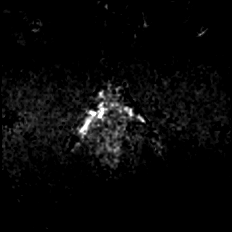

[Series 9: T2 · axial · 1.0mm · 1.04mm/px · 1 of 96 slices shown (3 of 3)]
[im 1/96]
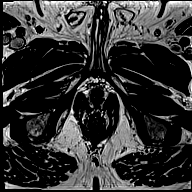

[Series 10: T1 · axial · 3.0mm · 1.15mm/px · 1 of 30 slices shown (1 of 48)]
[im 1/30]
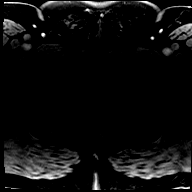

[Series 11: T1 · axial · 3.0mm · 1.15mm/px · 1 of 30 slices shown (2 of 48)]
[im 1/30]
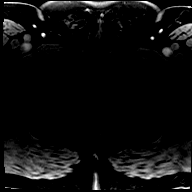

[Series 12: T1 · axial · 3.0mm · 1.15mm/px · 1 of 30 slices shown (3 of 48)]
[im 1/30]
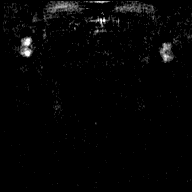

[Series 13: T1 · axial · 3.0mm · 1.15mm/px · 1 of 30 slices shown (4 of 48)]
[im 1/30]
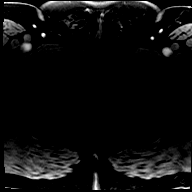

[Series 14: T1 · axial · 3.0mm · 1.15mm/px · 1 of 30 slices shown (5 of 48)]
[im 1/30]
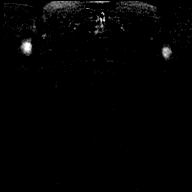

[Series 15: T1 · axial · 3.0mm · 1.15mm/px · 1 of 30 slices shown (6 of 48)]
[im 1/30]
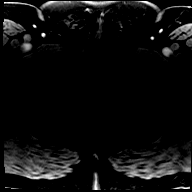

[Series 16: T1 · axial · 3.0mm · 1.15mm/px · 1 of 30 slices shown (7 of 48)]
[im 1/30]
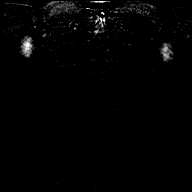

[Series 17: T1 · axial · 3.0mm · 1.15mm/px · 1 of 30 slices shown (8 of 48)]
[im 1/30]
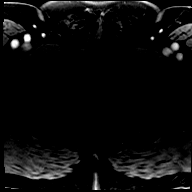

[Series 18: T1 · axial · 3.0mm · 1.15mm/px · 1 of 30 slices shown (9 of 48)]
[im 1/30]
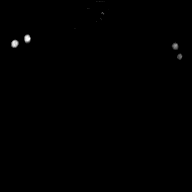

[Series 19: T1 · axial · 3.0mm · 1.15mm/px · 1 of 30 slices shown (10 of 48)]
[im 1/30]
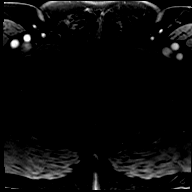

[Series 20: T1 · axial · 3.0mm · 1.15mm/px · 1 of 30 slices shown (11 of 48)]
[im 1/30]
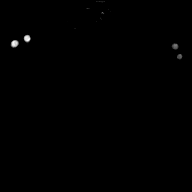

[Series 21: T1 · axial · 3.0mm · 1.15mm/px · 1 of 30 slices shown (12 of 48)]
[im 1/30]
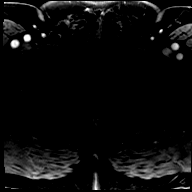

[Series 22: T1 · axial · 3.0mm · 1.15mm/px · 1 of 30 slices shown (13 of 48)]
[im 1/30]
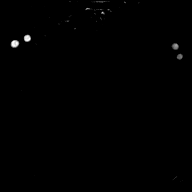

[Series 23: T1 · axial · 3.0mm · 1.15mm/px · 1 of 30 slices shown (14 of 48)]
[im 1/30]
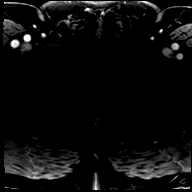

[Series 24: T1 · axial · 3.0mm · 1.15mm/px · 1 of 30 slices shown (15 of 48)]
[im 1/30]
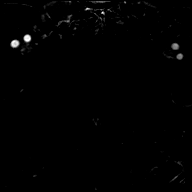

[Series 25: T1 · axial · 3.0mm · 1.15mm/px · 1 of 30 slices shown (16 of 48)]
[im 1/30]
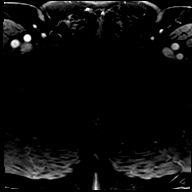

[Series 26: T1 · axial · 3.0mm · 1.15mm/px · 1 of 30 slices shown (17 of 48)]
[im 1/30]
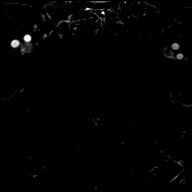

[Series 27: T1 · axial · 3.0mm · 1.15mm/px · 1 of 30 slices shown (18 of 48)]
[im 1/30]
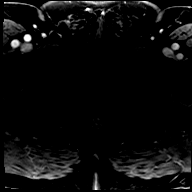

[Series 28: T1 · axial · 3.0mm · 1.15mm/px · 1 of 30 slices shown (19 of 48)]
[im 1/30]
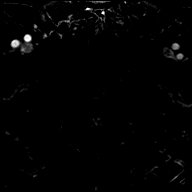

[Series 29: T1 · axial · 3.0mm · 1.15mm/px · 1 of 30 slices shown (20 of 48)]
[im 1/30]
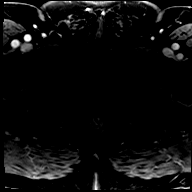

[Series 30: T1 · axial · 3.0mm · 1.15mm/px · 1 of 30 slices shown (21 of 48)]
[im 1/30]
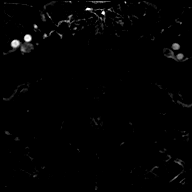

[Series 31: T1 · axial · 3.0mm · 1.15mm/px · 1 of 30 slices shown (22 of 48)]
[im 1/30]
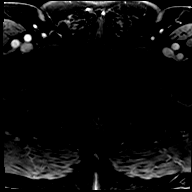

[Series 32: T1 · axial · 3.0mm · 1.15mm/px · 1 of 30 slices shown (23 of 48)]
[im 1/30]
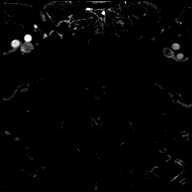

[Series 33: T1 · axial · 3.0mm · 1.15mm/px · 1 of 30 slices shown (24 of 48)]
[im 1/30]
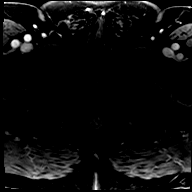

[Series 34: T1 · axial · 3.0mm · 1.15mm/px · 1 of 30 slices shown (25 of 48)]
[im 1/30]
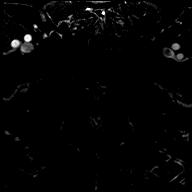

[Series 35: T1 · axial · 3.0mm · 1.15mm/px · 1 of 30 slices shown (26 of 48)]
[im 1/30]
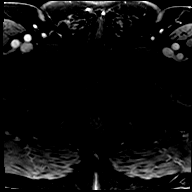

[Series 36: T1 · axial · 3.0mm · 1.15mm/px · 1 of 30 slices shown (27 of 48)]
[im 1/30]
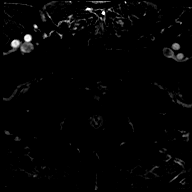

[Series 37: T1 · axial · 3.0mm · 1.15mm/px · 1 of 30 slices shown (28 of 48)]
[im 1/30]
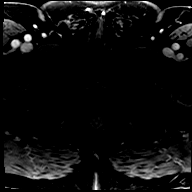

[Series 38: T1 · axial · 3.0mm · 1.15mm/px · 1 of 30 slices shown (29 of 48)]
[im 1/30]
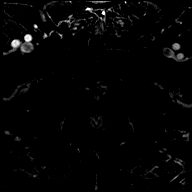

[Series 39: T1 · axial · 3.0mm · 1.15mm/px · 1 of 30 slices shown (30 of 48)]
[im 1/30]
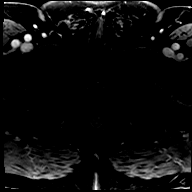

[Series 40: T1 · axial · 3.0mm · 1.15mm/px · 1 of 30 slices shown (31 of 48)]
[im 1/30]
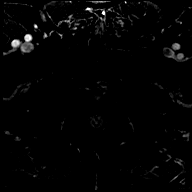

[Series 41: T1 · axial · 3.0mm · 1.15mm/px · 1 of 30 slices shown (32 of 48)]
[im 1/30]
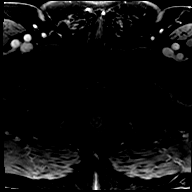

[Series 42: T1 · axial · 3.0mm · 1.15mm/px · 1 of 30 slices shown (33 of 48)]
[im 1/30]
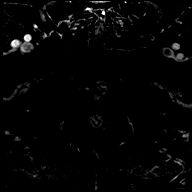

[Series 43: T1 · axial · 3.0mm · 1.15mm/px · 1 of 30 slices shown (34 of 48)]
[im 1/30]
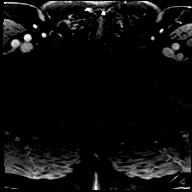

[Series 44: T1 · axial · 3.0mm · 1.15mm/px · 1 of 30 slices shown (35 of 48)]
[im 1/30]
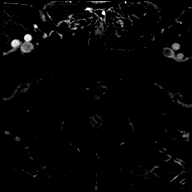

[Series 45: T1 · axial · 3.0mm · 1.15mm/px · 1 of 30 slices shown (36 of 48)]
[im 1/30]
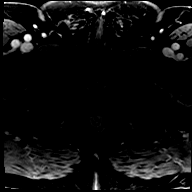

[Series 46: T1 · axial · 3.0mm · 1.15mm/px · 1 of 30 slices shown (37 of 48)]
[im 1/30]
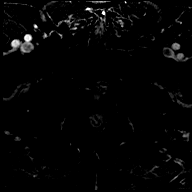

[Series 47: T1 · axial · 3.0mm · 1.15mm/px · 1 of 30 slices shown (38 of 48)]
[im 1/30]
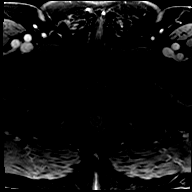

[Series 48: T1 · axial · 3.0mm · 1.15mm/px · 1 of 30 slices shown (39 of 48)]
[im 1/30]
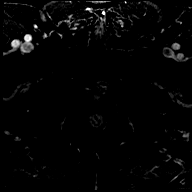

[Series 49: T1 · axial · 3.0mm · 1.15mm/px · 1 of 30 slices shown (40 of 48)]
[im 1/30]
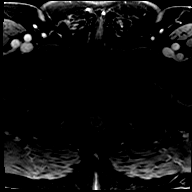

[Series 50: T1 · axial · 3.0mm · 1.15mm/px · 1 of 30 slices shown (41 of 48)]
[im 1/30]
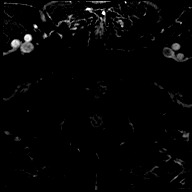

[Series 51: T1 · axial · 3.0mm · 1.15mm/px · 1 of 30 slices shown (42 of 48)]
[im 1/30]
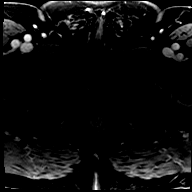

[Series 52: T1 · axial · 3.0mm · 1.15mm/px · 1 of 30 slices shown (43 of 48)]
[im 1/30]
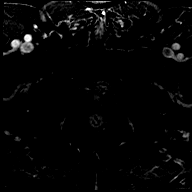

[Series 53: T1 · axial · 3.0mm · 1.15mm/px · 1 of 30 slices shown (44 of 48)]
[im 1/30]
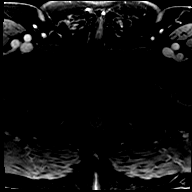

[Series 54: T1 · axial · 3.0mm · 1.15mm/px · 1 of 30 slices shown (45 of 48)]
[im 1/30]
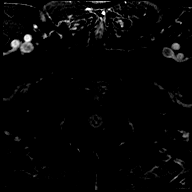

[Series 55: T1 · axial · 3.0mm · 1.15mm/px · 1 of 30 slices shown (46 of 48)]
[im 1/30]
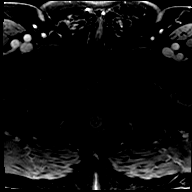

[Series 56: T1 · axial · 3.0mm · 1.15mm/px · 1 of 30 slices shown (47 of 48)]
[im 1/30]
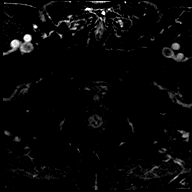

[Series 57: T1 · axial · 3.0mm · 1.15mm/px · 1 of 30 slices shown (48 of 48)]
[im 1/30]
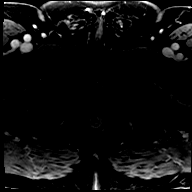

[56 of 56 positions shown; findings below may reference images not displayed]

FINDINGS: Prostate: The peripheral zone is thinned by the enlarged
transitional zone. There is no focal lesion on T2 weighted imaging
within the peripheral zone (series 5). Mild heterogeneity of signal
intensity within the peripheral zone.

There is no foci of restricted diffusion within the peripheral zone.
(Series 7).

The transitional zone is enlarged by capsulated nodules without
suspicious imaging characteristics on T2 weighted imaging.

No suspicious post contrast findings.

Volume: 7.1 x 6.4 x 6.7 (volume = 160)

Transcapsular spread:  Absent

Seminal vesicle involvement: Absent

Neurovascular bundle involvement: Absent

Pelvic adenopathy: Absent

Bone metastasis: Absent

Other findings: None
IMPRESSION: 1. No high-grade carcinoma identified within the thin peripheral
zone. PI-RADS: 2
2. Enlarged nodular transitional zone most consistent with benign
prostate hypertrophy. PI-RADS: 2.
3. Prostatomegaly.

## 2023-07-01 ENCOUNTER — Telehealth: Payer: Self-pay | Admitting: Urology

## 2023-07-01 NOTE — Telephone Encounter (Signed)
 Patient called regarding upcoming lab appt scheduled for 07/08/23. He has appt with Dr. Ace Holder on 07/13/23, also. He now lives in [place]  and does not want to make 2 trips. He is asking if he can have lab work done there, and have results sent to our office. He said there is a [facility] near where he lives. He said he would need an order to have it done there. Are we able to do this? Please advise patient.

## 2023-07-01 NOTE — Telephone Encounter (Signed)
 Not sure if we can order for another state, Dr Ace Holder do you want patient just to get his labs done same day as his OV

## 2023-07-05 NOTE — Telephone Encounter (Signed)
 Yes, please fax order to local [facility]

## 2023-07-05 NOTE — Telephone Encounter (Signed)
 Patient advised, patient will look to see what location he can go to if he is not able to get this done he will get PSA done at the time of the visit.

## 2023-07-08 ENCOUNTER — Other Ambulatory Visit: Payer: Self-pay

## 2023-07-13 ENCOUNTER — Ambulatory Visit: Payer: Self-pay | Admitting: Urology
